# Patient Record
Sex: Male | Born: 1965 | Race: White | Hispanic: No | Marital: Married | Smoking: Former smoker
Health system: Southern US, Community
[De-identification: ages and names within clinical notes are randomized; demographics above are authoritative.]

## PROBLEM LIST (undated history)

## (undated) DIAGNOSIS — G5600 Carpal tunnel syndrome, unspecified upper limb: Secondary | ICD-10-CM

## (undated) DIAGNOSIS — M542 Cervicalgia: Secondary | ICD-10-CM

## (undated) DIAGNOSIS — I10 Essential (primary) hypertension: Secondary | ICD-10-CM

## (undated) DIAGNOSIS — R252 Cramp and spasm: Secondary | ICD-10-CM

## (undated) DIAGNOSIS — K219 Gastro-esophageal reflux disease without esophagitis: Secondary | ICD-10-CM

## (undated) DIAGNOSIS — Z86010 Personal history of colonic polyps: Secondary | ICD-10-CM

## (undated) DIAGNOSIS — T7840XA Allergy, unspecified, initial encounter: Secondary | ICD-10-CM

## (undated) DIAGNOSIS — K5790 Diverticulosis of intestine, part unspecified, without perforation or abscess without bleeding: Secondary | ICD-10-CM

## (undated) DIAGNOSIS — M1990 Unspecified osteoarthritis, unspecified site: Secondary | ICD-10-CM

## (undated) HISTORY — DX: Unspecified osteoarthritis, unspecified site: M19.90

## (undated) HISTORY — PX: HERNIA REPAIR: SHX51

## (undated) HISTORY — DX: Essential (primary) hypertension: I10

## (undated) HISTORY — PX: TYMPANOSTOMY TUBE PLACEMENT: SHX32

## (undated) HISTORY — PX: APPENDECTOMY: SHX54

## (undated) HISTORY — DX: Personal history of colonic polyps: Z86.010

## (undated) HISTORY — DX: Allergy, unspecified, initial encounter: T78.40XA

## (undated) HISTORY — DX: Carpal tunnel syndrome, unspecified upper limb: G56.00

## (undated) HISTORY — DX: Cramp and spasm: R25.2

## (undated) HISTORY — DX: Gastro-esophageal reflux disease without esophagitis: K21.9

## (undated) HISTORY — DX: Diverticulosis of intestine, part unspecified, without perforation or abscess without bleeding: K57.90

## (undated) HISTORY — DX: Cervicalgia: M54.2

---

## 2000-06-02 ENCOUNTER — Ambulatory Visit (HOSPITAL_COMMUNITY): Admission: RE | Admit: 2000-06-02 | Discharge: 2000-06-02 | Payer: Self-pay | Admitting: Surgery

## 2003-02-09 ENCOUNTER — Encounter: Admission: RE | Admit: 2003-02-09 | Discharge: 2003-02-09 | Payer: Self-pay | Admitting: Internal Medicine

## 2003-02-09 ENCOUNTER — Encounter: Payer: Self-pay | Admitting: Internal Medicine

## 2004-09-25 ENCOUNTER — Ambulatory Visit: Payer: Self-pay | Admitting: Internal Medicine

## 2006-04-08 ENCOUNTER — Ambulatory Visit: Payer: Self-pay | Admitting: Internal Medicine

## 2006-04-08 LAB — CONVERTED CEMR LAB
ALT: 30 units/L (ref 0–40)
AST: 29 units/L (ref 0–37)
Albumin: 4.3 g/dL (ref 3.5–5.2)
Alkaline Phosphatase: 53 units/L (ref 39–117)
BUN: 10 mg/dL (ref 6–23)
CO2: 32 meq/L (ref 19–32)
Calcium: 9.5 mg/dL (ref 8.4–10.5)
Chloride: 103 meq/L (ref 96–112)
Chol/HDL Ratio, serum: 2.7
Cholesterol: 153 mg/dL (ref 0–200)
Creatinine, Ser: 0.9 mg/dL (ref 0.4–1.5)
GFR calc non Af Amer: 99 mL/min
Glomerular Filtration Rate, Af Am: 120 mL/min/{1.73_m2}
Glucose, Bld: 98 mg/dL (ref 70–99)
HCT: 42.7 % (ref 39.0–52.0)
HDL: 56.6 mg/dL (ref >39.0)
Hemoglobin: 14.4 g/dL (ref 13.0–17.0)
LDL Cholesterol: 89 mg/dL (ref 0–99)
MCHC: 33.8 g/dL (ref 30.0–36.0)
MCV: 87.9 fL (ref 78.0–100.0)
Platelets: 166 10*3/uL (ref 150–400)
Potassium: 4.8 meq/L (ref 3.5–5.1)
RBC: 4.86 M/uL (ref 4.22–5.81)
RDW: 12.6 % (ref 11.5–14.6)
Sodium: 140 meq/L (ref 135–145)
TSH: 2.07 microintl units/mL (ref 0.35–5.50)
Total Bilirubin: 0.9 mg/dL (ref 0.3–1.2)
Total Protein: 7.2 g/dL (ref 6.0–8.3)
Triglyceride fasting, serum: 38 mg/dL (ref 0–149)
VLDL: 8 mg/dL (ref 0–40)
WBC: 7.1 10*3/uL (ref 4.5–10.5)

## 2006-08-20 ENCOUNTER — Ambulatory Visit: Payer: Self-pay | Admitting: Internal Medicine

## 2007-05-14 ENCOUNTER — Ambulatory Visit: Payer: Self-pay | Admitting: Internal Medicine

## 2007-10-19 ENCOUNTER — Telehealth (INDEPENDENT_AMBULATORY_CARE_PROVIDER_SITE_OTHER): Payer: Self-pay | Admitting: *Deleted

## 2007-10-20 ENCOUNTER — Ambulatory Visit: Payer: Self-pay | Admitting: Internal Medicine

## 2007-10-20 DIAGNOSIS — M546 Pain in thoracic spine: Secondary | ICD-10-CM

## 2007-10-22 ENCOUNTER — Telehealth: Payer: Self-pay | Admitting: Internal Medicine

## 2007-11-06 ENCOUNTER — Encounter: Payer: Self-pay | Admitting: Internal Medicine

## 2007-11-12 ENCOUNTER — Encounter: Admission: RE | Admit: 2007-11-12 | Discharge: 2007-11-12 | Payer: Self-pay | Admitting: Sports Medicine

## 2008-03-02 ENCOUNTER — Ambulatory Visit: Payer: Self-pay | Admitting: Internal Medicine

## 2010-09-14 NOTE — Op Note (Signed)
[facility]  Patient:    Dustin Mendez, Dustin Mendez                         MRN: [id number] Proc. Date: 06/02/00 Adm. Date:  [id number] Attending:  Abigail Miyamoto A                           Operative Report  PREOPERATIVE DIAGNOSIS:  Bilateral inguinal hernia.  POSTOPERATIVE DIAGNOSIS:  Bilateral inguinal hernia.  OPERATION/PROCEDURE:  Laparoscopic bilateral inguinal hernia repair with mesh.  SURGEON: Abigail Miyamoto, M.D.  ANESTHESIA:  General endotracheal anesthesia and 1/4% Marcaine plain.  ESTIMATED BLOOD LOSS:  Minimal.  DESCRIPTION OF PROCEDURE:  The patient was brought to the operating room, and identified as [name].  He was placed supine on the operating room table and general anesthesia was induced.  His abdomen was then prepped and draped in the usual sterile fashion.  Using a #15 blade a small transverse incision was made below the umbilicus.  The incision was carried down to the fascia which was then opened with the scalpel.  The rectus muscle was then identified and plane was developed underneath.  The dissecting balloon was then placed underneath the rectus sheath and manipulated down to the pelvis.  The dissecting balloon was then insufflated dissecting out the preperitoneal space.  Next, the dissecting balloon was removed and a small balloon ______ port was placed through the incision and insufflation with CO2 gas of the preperitoneal space was performed.  Next, two 5 mm ports were placed in the patients midline under direct vision. Dissection was then carried out in the left inguinal area.  The testicular cord and its structures were easily identified as well as a large direct hernia defect.  A small and direct hernia sac was also identified and reduced.  Next, dissection was carried out on the right cord structures as well.  The patient also had a bilateral inguinal hernia defect in this area which was easily reduced as  well.  Next, two separate 6 x 6 pieces of Mersilene mesh were brought out and inserted into the fascia appropriately.  Each piece of mesh was then passed through the umbilical trocar and then unfolded in both the right and left inguinal areas.  These pieces of mesh were then tacked in place to the Coopers ligament and pubic symphysis up the medial abdominal wall and out over the abdominal wall laterally.  Each piece of mesh was placed in an onlay fashion over the testicular cord structures and hernia defect.  Excellent coverage of both defects appeared to be achieved.  Hemostasis also appeared to be achieved.  At this point, the 5 mm ports were removed and the preperitoneal space was deflated allowing the mesh to lie back in place appropriately.  All ports were then removed.  The fascial defect of the umbilicus was then closed with a figure-of-eight #0 Vicryl suture.  All skin incisions were then anesthetized with 1/4% Marcaine and then closed with 4-0 Vicryl subcuticular sutures. Steri-Strips, gauze and tape were then applied.  The patient tolerated the procedure well.  All sponge, needle, and instrument counts were correct during the procedure.  The patient was then extubated in the operating room and taken, in stable condition, to the recovery room. DD:  06/02/00 TD:  06/03/00 Job: [id number]

## 2014-05-11 ENCOUNTER — Telehealth: Payer: Self-pay | Admitting: Internal Medicine

## 2014-05-11 NOTE — Telephone Encounter (Signed)
Ok w/ me 

## 2014-05-11 NOTE — Telephone Encounter (Signed)
Patient would like to transfer to Dr. Charlett Blake. Is this ok?

## 2014-05-11 NOTE — Telephone Encounter (Signed)
OK with me.

## 2014-05-12 NOTE — Telephone Encounter (Signed)
Appointment scheduled.

## 2014-10-21 ENCOUNTER — Encounter: Payer: Self-pay | Admitting: Family Medicine

## 2014-10-21 ENCOUNTER — Ambulatory Visit (INDEPENDENT_AMBULATORY_CARE_PROVIDER_SITE_OTHER): Payer: 59 | Admitting: Family Medicine

## 2014-10-21 VITALS — BP 120/78 | HR 63 | Temp 98.7°F | Ht 71.0 in | Wt 180.1 lb

## 2014-10-21 DIAGNOSIS — M542 Cervicalgia: Secondary | ICD-10-CM

## 2014-10-21 DIAGNOSIS — G5601 Carpal tunnel syndrome, right upper limb: Secondary | ICD-10-CM | POA: Diagnosis not present

## 2014-10-21 DIAGNOSIS — T7840XD Allergy, unspecified, subsequent encounter: Secondary | ICD-10-CM | POA: Diagnosis not present

## 2014-10-21 DIAGNOSIS — G5603 Carpal tunnel syndrome, bilateral upper limbs: Secondary | ICD-10-CM

## 2014-10-21 DIAGNOSIS — T7840XA Allergy, unspecified, initial encounter: Secondary | ICD-10-CM | POA: Insufficient documentation

## 2014-10-21 DIAGNOSIS — Z0000 Encounter for general adult medical examination without abnormal findings: Secondary | ICD-10-CM

## 2014-10-21 DIAGNOSIS — G5602 Carpal tunnel syndrome, left upper limb: Secondary | ICD-10-CM

## 2014-10-21 DIAGNOSIS — K219 Gastro-esophageal reflux disease without esophagitis: Secondary | ICD-10-CM

## 2014-10-21 DIAGNOSIS — G5600 Carpal tunnel syndrome, unspecified upper limb: Secondary | ICD-10-CM

## 2014-10-21 HISTORY — DX: Cervicalgia: M54.2

## 2014-10-21 HISTORY — DX: Carpal tunnel syndrome, unspecified upper limb: G56.00

## 2014-10-21 LAB — LIPID PANEL
Cholesterol: 180 mg/dL (ref 0–200)
HDL: 66.3 mg/dL (ref >39.00)
LDL Cholesterol: 99 mg/dL (ref 0–99)
NonHDL: 113.7
TRIGLYCERIDES: 73 mg/dL (ref 0.0–149.0)
Total CHOL/HDL Ratio: 3
VLDL: 14.6 mg/dL (ref 0.0–40.0)

## 2014-10-21 LAB — CBC
HCT: 44.2 % (ref 39.0–52.0)
Hemoglobin: 14.6 g/dL (ref 13.0–17.0)
MCHC: 33 g/dL (ref 30.0–36.0)
MCV: 88.2 fl (ref 78.0–100.0)
Platelets: 203 10*3/uL (ref 150.0–400.0)
RBC: 5.01 Mil/uL (ref 4.22–5.81)
RDW: 13.3 % (ref 11.5–15.5)
WBC: 6.2 10*3/uL (ref 4.0–10.5)

## 2014-10-21 LAB — COMPREHENSIVE METABOLIC PANEL
ALT: 41 U/L (ref 0–53)
AST: 35 U/L (ref 0–37)
Albumin: 4.4 g/dL (ref 3.5–5.2)
Alkaline Phosphatase: 54 U/L (ref 39–117)
BUN: 20 mg/dL (ref 6–23)
CO2: 32 mEq/L (ref 19–32)
Calcium: 9.4 mg/dL (ref 8.4–10.5)
Chloride: 100 mEq/L (ref 96–112)
Creatinine, Ser: 0.83 mg/dL (ref 0.40–1.50)
GFR: 104.71 mL/min (ref >60.00)
Glucose, Bld: 85 mg/dL (ref 70–99)
Potassium: 4.1 mEq/L (ref 3.5–5.1)
Sodium: 136 mEq/L (ref 135–145)
Total Bilirubin: 0.5 mg/dL (ref 0.2–1.2)
Total Protein: 7.4 g/dL (ref 6.0–8.3)

## 2014-10-21 LAB — TSH: TSH: 1.91 u[IU]/mL (ref 0.35–4.50)

## 2014-10-21 NOTE — Progress Notes (Signed)
Dustin Mendez  [id number] [birth year] 10/21/2014      Progress Note-Follow Up  Subjective  Chief Complaint  Chief Complaint  Patient presents with  . Establish Care    transfer Dr. Larose Kells    HPI  Patient is a 49 y.o. male in today for routine medical care. He is in today with his wife to establish care. He has not been seen in the [facility] 2009. He is generally healthy but does struggle with intermittent allergies, reflux, neck and shoulder pain. Does have some trouble with numbness tingling in his fingers at times he believes is related to arthritis and carpal tunnel. No recent acute illness or febrile concern. Denies CP/palp/SOB/HA/congestion/fevers/GI or GU c/o. Taking meds as prescribed  Past Medical History  Diagnosis Date  . Allergy   . Carpal tunnel syndrome   . Arthritis     Past Surgical History  Procedure Laterality Date  . Hernia repair    . Appendectomy      done as a teenager    Family History  Problem Relation Age of Onset  . Arthritis Mother   . Hypertension Mother   . Arthritis Father   . Hypertension Father     History   Social History  . Marital Status: Married    Spouse Name: N/A  . Number of Children: N/A  . Years of Education: 14   Occupational History  . Auto Mechanic    Social History Main Topics  . Smoking status: Never Smoker   . Smokeless tobacco: Not on file  . Alcohol Use: 0.0 oz/week    0 Standard drinks or equivalent per week  . Drug Use: No  . Sexual Activity: Not on file   Other Topics Concern  . Not on file   Social History Narrative  . No narrative on file    No current outpatient prescriptions on file prior to visit.   No current facility-administered medications on file prior to visit.    Allergies not on file  Review of Systems  Review of Systems  Constitutional: Negative for fever, chills and malaise/fatigue.  HENT: Negative for congestion, hearing loss and nosebleeds.   Eyes: Negative for discharge.    Respiratory: Negative for cough, sputum production, shortness of breath and wheezing.   Cardiovascular: Negative for chest pain, palpitations and leg swelling.  Gastrointestinal: Negative for heartburn, nausea, vomiting, abdominal pain, diarrhea, constipation and blood in stool.  Genitourinary: Negative for dysuria, urgency, frequency and hematuria.  Musculoskeletal: Positive for joint pain. Negative for myalgias, back pain and falls.  Skin: Negative for rash.  Neurological: Negative for dizziness, tremors, sensory change, focal weakness, loss of consciousness, weakness and headaches.  Endo/Heme/Allergies: Negative for polydipsia. Does not bruise/bleed easily.  Psychiatric/Behavioral: Negative for depression and suicidal ideas. The patient is not nervous/anxious and does not have insomnia.     Objective  BP 120/78 mmHg  Pulse 63  Temp(Src) 98.7 F (37.1 C) (Oral)  Ht 5\' 11"  (1.803 m)  Wt 180 lb 2 oz (81.704 kg)  BMI 25.13 kg/m2  SpO2 96%  Physical Exam  Physical Exam  Constitutional: He is oriented to person, place, and time and well-developed, well-nourished, and in no distress. No distress.  HENT:  Head: Normocephalic and atraumatic.  Eyes: Conjunctivae are normal.  Neck: Neck supple. No thyromegaly present.  Cardiovascular: Normal rate, regular rhythm and normal heart sounds.   No murmur heard. Pulmonary/Chest: Effort normal and breath sounds normal. No respiratory distress.  Abdominal: He exhibits no  distension and no mass. There is no tenderness.  Musculoskeletal: He exhibits no edema.  Neurological: He is alert and oriented to person, place, and time.  Skin: Skin is warm.  Psychiatric: Memory, affect and judgment normal.    Lab Results  Component Value Date   TSH 2.07 04/08/2006   Lab Results  Component Value Date   WBC 7.1 04/08/2006   HGB 14.4 04/08/2006   HCT 42.7 04/08/2006   MCV 87.9 04/08/2006   PLT 166 04/08/2006   Lab Results  Component Value Date    CREATININE 0.9 04/08/2006   BUN 10 04/08/2006   NA 140 04/08/2006   K 4.8 04/08/2006   CL 103 04/08/2006   CO2 32 04/08/2006   Lab Results  Component Value Date   ALT 30 04/08/2006   AST 29 04/08/2006   ALKPHOS 53 04/08/2006   BILITOT 0.9 04/08/2006   Lab Results  Component Value Date   CHOL 153 04/08/2006   Lab Results  Component Value Date   HDL 56.6 04/08/2006   Lab Results  Component Value Date   LDLCALC 89 04/08/2006   Lab Results  Component Value Date   TRIG 38 04/08/2006   Lab Results  Component Value Date   CHOLHDL 2.7 CALC 04/08/2006     Assessment & Plan  Preventative health care Patient encouraged to maintain heart healthy diet, regular exercise, adequate sleep. Consider daily probiotics. Take medications as prescribed. Given and reviewed copy of ACP documents from [employer] and encouraged to complete and return. Labs ordered and reviewed  Neck pain With some pain and numbess intermittently into right shoulder and arm. Encouraged moist heat and gentle stretching as tolerated. May try NSAIDs and prescription meds as directed and report if symptoms worsen or seek immediate care, salon pas patches and or gel and report if symptoms worsen  Allergy Has had allergy shots in past but is managing at present, he does feel the shots helped him in past. Is using Claritin and an over the counter herbal med called Zyflamed and reports this is helpful  Gastro-esophageal reflux Patient reports a weak stomach. Avoid offending foods, start probiotics. Do not eat large meals in late evening and consider raising head of bed.   CTS (carpal tunnel syndrome) Encouraged ice and pain patch with splinting and report if no improvement

## 2014-10-21 NOTE — Progress Notes (Signed)
Pre visit review using our clinic review tool, if applicable. No additional management support is needed unless otherwise documented below in the visit note. 

## 2014-10-21 NOTE — Patient Instructions (Addendum)
Can increase Claritin to twice daily Can add Flonase Can call to add Singulair/Montelukast Nasal saline after working outside [employer] or Aspercreme with Lidocaine patch Ice twice daily and splints at bed  Curcumen/Turmeric daily Vitamin D 2000 IU daily Flaxseed oil or seeds, freshly ground   Preventive Care for Adults A healthy lifestyle and preventive care can promote health and wellness. Preventive health guidelines for men include the following key practices:  A routine yearly physical is a good way to check with your health care provider about your health and preventative screening. It is a chance to share any concerns and updates on your health and to receive a thorough exam.  Visit your dentist for a routine exam and preventative care every 6 months. Brush your teeth twice a day and floss once a day. Good oral hygiene prevents tooth decay and gum disease.  The frequency of eye exams is based on your age, health, family medical history, use of contact lenses, and other factors. Follow your health care provider's recommendations for frequency of eye exams.  Eat a healthy diet. Foods such as vegetables, fruits, whole grains, low-fat dairy products, and lean protein foods contain the nutrients you need without too many calories. Decrease your intake of foods high in solid fats, added sugars, and salt. Eat the right amount of calories for you.Get information about a proper diet from your health care provider, if necessary.  Regular physical exercise is one of the most important things you can do for your health. Most adults should get at least 150 minutes of moderate-intensity exercise (any activity that increases your heart rate and causes you to sweat) each week. In addition, most adults need muscle-strengthening exercises on 2 or more days a week.  Maintain a healthy weight. The body mass index (BMI) is a screening tool to identify possible weight problems. It provides an  estimate of body fat based on height and weight. Your health care provider can find your BMI and can help you achieve or maintain a healthy weight.For adults 20 years and older:  A BMI below 18.5 is considered underweight.  A BMI of 18.5 to 24.9 is normal.  A BMI of 25 to 29.9 is considered overweight.  A BMI of 30 and above is considered obese.  Maintain normal blood lipids and cholesterol levels by exercising and minimizing your intake of saturated fat. Eat a balanced diet with plenty of fruit and vegetables. Blood tests for lipids and cholesterol should begin at age 53 and be repeated every 5 years. If your lipid or cholesterol levels are high, you are over 50, or you are at high risk for heart disease, you may need your cholesterol levels checked more frequently.Ongoing high lipid and cholesterol levels should be treated with medicines if diet and exercise are not working.  If you smoke, find out from your health care provider how to quit. If you do not use tobacco, do not start.  Lung cancer screening is recommended for adults aged 82-80 years who are at high risk for developing lung cancer because of a history of smoking. A yearly low-dose CT scan of the lungs is recommended for people who have at least a 30-pack-year history of smoking and are a current smoker or have quit within the past 15 years. A pack year of smoking is smoking an average of 1 pack of cigarettes a day for 1 year (for example: 1 pack a day for 30 years or 2 packs a day for  15 years). Yearly screening should continue until the smoker has stopped smoking for at least 15 years. Yearly screening should be stopped for people who develop a health problem that would prevent them from having lung cancer treatment.  If you choose to drink alcohol, do not have more than 2 drinks per day. One drink is considered to be 12 ounces (355 mL) of beer, 5 ounces (148 mL) of Guerrier, or 1.5 ounces (44 mL) of liquor.  Avoid use of street  drugs. Do not share needles with anyone. Ask for help if you need support or instructions about stopping the use of drugs.  High blood pressure causes heart disease and increases the risk of stroke. Your blood pressure should be checked at least every 1-2 years. Ongoing high blood pressure should be treated with medicines, if weight loss and exercise are not effective.  If you are 66-1 years old, ask your health care provider if you should take aspirin to prevent heart disease.  Diabetes screening involves taking a blood sample to check your fasting blood sugar level. This should be done once every 3 years, after age 102, if you are within normal weight and without risk factors for diabetes. Testing should be considered at a younger age or be carried out more frequently if you are overweight and have at least 1 risk factor for diabetes.  Colorectal cancer can be detected and often prevented. Most routine colorectal cancer screening begins at the age of 49 and continues through age 20. However, your health care provider may recommend screening at an earlier age if you have risk factors for colon cancer. On a yearly basis, your health care provider may provide home test kits to check for hidden blood in the stool. Use of a small camera at the end of a tube to directly examine the colon (sigmoidoscopy or colonoscopy) can detect the earliest forms of colorectal cancer. Talk to your health care provider about this at age 19, when routine screening begins. Direct exam of the colon should be repeated every 5-10 years through age 51, unless early forms of precancerous polyps or small growths are found.  People who are at an increased risk for hepatitis B should be screened for this virus. You are considered at high risk for hepatitis B if:  You were born in a country where hepatitis B occurs often. Talk with your health care provider about which countries are considered high risk.  Your parents were born in a  high-risk country and you have not received a shot to protect against hepatitis B (hepatitis B vaccine).  You have HIV or AIDS.  You use needles to inject street drugs.  You live with, or have sex with, someone who has hepatitis B.  You are a man who has sex with other men (MSM).  You get hemodialysis treatment.  You take certain medicines for conditions such as cancer, organ transplantation, and autoimmune conditions.  Hepatitis C blood testing is recommended for all people born from [birth year] through 1965 and any individual with known risks for hepatitis C.  Practice safe sex. Use condoms and avoid high-risk sexual practices to reduce the spread of sexually transmitted infections (STIs). STIs include gonorrhea, chlamydia, syphilis, trichomonas, herpes, HPV, and human immunodeficiency virus (HIV). Herpes, HIV, and HPV are viral illnesses that have no cure. They can result in disability, cancer, and death.  If you are at risk of being infected with HIV, it is recommended that you take a prescription medicine daily  to prevent HIV infection. This is called preexposure prophylaxis (PrEP). You are considered at risk if:  You are a man who has sex with other men (MSM) and have other risk factors.  You are a heterosexual man, are sexually active, and are at increased risk for HIV infection.  You take drugs by injection.  You are sexually active with a partner who has HIV.  Talk with your health care provider about whether you are at high risk of being infected with HIV. If you choose to begin PrEP, you should first be tested for HIV. You should then be tested every 3 months for as long as you are taking PrEP.  A one-time screening for abdominal aortic aneurysm (AAA) and surgical repair of large AAAs by ultrasound are recommended for men ages 39 to 23 years who are current or former smokers.  Healthy men should no longer receive prostate-specific antigen (PSA) blood tests as part of routine  cancer screening. Talk with your health care provider about prostate cancer screening.  Testicular cancer screening is not recommended for adult males who have no symptoms. Screening includes self-exam, a health care provider exam, and other screening tests. Consult with your health care provider about any symptoms you have or any concerns you have about testicular cancer.  Use sunscreen. Apply sunscreen liberally and repeatedly throughout the day. You should seek shade when your shadow is shorter than you. Protect yourself by wearing long sleeves, pants, a wide-brimmed hat, and sunglasses year round, whenever you are outdoors.  Once a month, do a whole-body skin exam, using a mirror to look at the skin on your back. Tell your health care provider about new moles, moles that have irregular borders, moles that are larger than a pencil eraser, or moles that have changed in shape or color.  Stay current with required vaccines (immunizations).  Influenza vaccine. All adults should be immunized every year.  Tetanus, diphtheria, and acellular pertussis (Td, Tdap) vaccine. An adult who has not previously received Tdap or who does not know his vaccine status should receive 1 dose of Tdap. This initial dose should be followed by tetanus and diphtheria toxoids (Td) booster doses every 10 years. Adults with an unknown or incomplete history of completing a 3-dose immunization series with Td-containing vaccines should begin or complete a primary immunization series including a Tdap dose. Adults should receive a Td booster every 10 years.  Varicella vaccine. An adult without evidence of immunity to varicella should receive 2 doses or a second dose if he has previously received 1 dose.  Human papillomavirus (HPV) vaccine. Males aged 81-21 years who have not received the vaccine previously should receive the 3-dose series. Males aged 22-26 years may be immunized. Immunization is recommended through the age of 89  years for any male who has sex with males and did not get any or all doses earlier. Immunization is recommended for any person with an immunocompromised condition through the age of 72 years if he did not get any or all doses earlier. During the 3-dose series, the second dose should be obtained 4-8 weeks after the first dose. The third dose should be obtained 24 weeks after the first dose and 16 weeks after the second dose.  Zoster vaccine. One dose is recommended for adults aged 68 years or older unless certain conditions are present.  Measles, mumps, and rubella (MMR) vaccine. Adults born before [birth year] generally are considered immune to measles and mumps. Adults born in [birth year] or later should  have 1 or more doses of MMR vaccine unless there is a contraindication to the vaccine or there is laboratory evidence of immunity to each of the three diseases. A routine second dose of MMR vaccine should be obtained at least 28 days after the first dose for students attending postsecondary schools, health care workers, or international travelers. People who received inactivated measles vaccine or an unknown type of measles vaccine during 1963-1967 should receive 2 doses of MMR vaccine. People who received inactivated mumps vaccine or an unknown type of mumps vaccine before 1979 and are at high risk for mumps infection should consider immunization with 2 doses of MMR vaccine. Unvaccinated health care workers born before [birth year] who lack laboratory evidence of measles, mumps, or rubella immunity or laboratory confirmation of disease should consider measles and mumps immunization with 2 doses of MMR vaccine or rubella immunization with 1 dose of MMR vaccine.  Pneumococcal 13-valent conjugate (PCV13) vaccine. When indicated, a person who is uncertain of his immunization history and has no record of immunization should receive the PCV13 vaccine. An adult aged 28 years or older who has certain medical conditions and has not been  previously immunized should receive 1 dose of PCV13 vaccine. This PCV13 should be followed with a dose of pneumococcal polysaccharide (PPSV23) vaccine. The PPSV23 vaccine dose should be obtained at least 8 weeks after the dose of PCV13 vaccine. An adult aged 44 years or older who has certain medical conditions and previously received 1 or more doses of PPSV23 vaccine should receive 1 dose of PCV13. The PCV13 vaccine dose should be obtained 1 or more years after the last PPSV23 vaccine dose.  Pneumococcal polysaccharide (PPSV23) vaccine. When PCV13 is also indicated, PCV13 should be obtained first. All adults aged 84 years and older should be immunized. An adult younger than age 13 years who has certain medical conditions should be immunized. Any person who resides in a nursing home or long-term care facility should be immunized. An adult smoker should be immunized. People with an immunocompromised condition and certain other conditions should receive both PCV13 and PPSV23 vaccines. People with human immunodeficiency virus (HIV) infection should be immunized as soon as possible after diagnosis. Immunization during chemotherapy or radiation therapy should be avoided. Routine use of PPSV23 vaccine is not recommended for American Indians, [place] Natives, or people younger than 65 years unless there are medical conditions that require PPSV23 vaccine. When indicated, people who have unknown immunization and have no record of immunization should receive PPSV23 vaccine. One-time revaccination 5 years after the first dose of PPSV23 is recommended for people aged 19-64 years who have chronic kidney failure, nephrotic syndrome, asplenia, or immunocompromised conditions. People who received 1-2 doses of PPSV23 before age 8 years should receive another dose of PPSV23 vaccine at age 68 years or later if at least 5 years have passed since the previous dose. Doses of PPSV23 are not needed for people immunized with PPSV23 at or  after age 61 years.  Meningococcal vaccine. Adults with asplenia or persistent complement component deficiencies should receive 2 doses of quadrivalent meningococcal conjugate (MenACWY-D) vaccine. The doses should be obtained at least 2 months apart. Microbiologists working with certain meningococcal bacteria, [place] recruits, people at risk during an outbreak, and people who travel to or live in countries with a high rate of meningitis should be immunized. A first-year college student up through age 82 years who is living in a residence hall should receive a dose if he did not receive  a dose on or after his 16th birthday. Adults who have certain high-risk conditions should receive one or more doses of vaccine.  Hepatitis A vaccine. Adults who wish to be protected from this disease, have certain high-risk conditions, work with hepatitis A-infected animals, work in hepatitis A research labs, or travel to or work in countries with a high rate of hepatitis A should be immunized. Adults who were previously unvaccinated and who anticipate close contact with an international adoptee during the first 60 days after arrival in the [place] from a country with a high rate of hepatitis A should be immunized.  Hepatitis B vaccine. Adults should be immunized if they wish to be protected from this disease, have certain high-risk conditions, may be exposed to blood or other infectious body fluids, are household contacts or sex partners of hepatitis B positive people, are clients or workers in certain care facilities, or travel to or work in countries with a high rate of hepatitis B.  Haemophilus influenzae type b (Hib) vaccine. A previously unvaccinated person with asplenia or sickle cell disease or having a scheduled splenectomy should receive 1 dose of Hib vaccine. Regardless of previous immunization, a recipient of a hematopoietic stem cell transplant should receive a 3-dose series 6-12 months after his  successful transplant. Hib vaccine is not recommended for adults with HIV infection. Preventive Service / Frequency Ages 31 to 59  Blood pressure check.** / Every 1 to 2 years.  Lipid and cholesterol check.** / Every 5 years beginning at age 45.  Hepatitis C blood test.** / For any individual with known risks for hepatitis C.  Skin self-exam. / Monthly.  Influenza vaccine. / Every year.  Tetanus, diphtheria, and acellular pertussis (Tdap, Td) vaccine.** / Consult your health care provider. 1 dose of Td every 10 years.  Varicella vaccine.** / Consult your health care provider.  HPV vaccine. / 3 doses over 6 months, if 42 or younger.  Measles, mumps, rubella (MMR) vaccine.** / You need at least 1 dose of MMR if you were born in 1957 or later. You may also need a second dose.  Pneumococcal 13-valent conjugate (PCV13) vaccine.** / Consult your health care provider.  Pneumococcal polysaccharide (PPSV23) vaccine.** / 1 to 2 doses if you smoke cigarettes or if you have certain conditions.  Meningococcal vaccine.** / 1 dose if you are age 72 to 83 years and a Market researcher living in a residence hall, or have one of several medical conditions. You may also need additional booster doses.  Hepatitis A vaccine.** / Consult your health care provider.  Hepatitis B vaccine.** / Consult your health care provider.  Haemophilus influenzae type b (Hib) vaccine.** / Consult your health care provider. Ages 57 to 1  Blood pressure check.** / Every 1 to 2 years.  Lipid and cholesterol check.** / Every 5 years beginning at age 61.  Lung cancer screening. / Every year if you are aged 27-80 years and have a 30-pack-year history of smoking and currently smoke or have quit within the past 15 years. Yearly screening is stopped once you have quit smoking for at least 15 years or develop a health problem that would prevent you from having lung cancer treatment.  Fecal occult blood test (FOBT)  of stool. / Every year beginning at age 38 and continuing until age 49. You may not have to do this test if you get a colonoscopy every 10 years.  Flexible sigmoidoscopy** or colonoscopy.** / Every 5 years for a  flexible sigmoidoscopy or every 10 years for a colonoscopy beginning at age 66 and continuing until age 38.  Hepatitis C blood test.** / For all people born from [birth year] through 1965 and any individual with known risks for hepatitis C.  Skin self-exam. / Monthly.  Influenza vaccine. / Every year.  Tetanus, diphtheria, and acellular pertussis (Tdap/Td) vaccine.** / Consult your health care provider. 1 dose of Td every 10 years.  Varicella vaccine.** / Consult your health care provider.  Zoster vaccine.** / 1 dose for adults aged 75 years or older.  Measles, mumps, rubella (MMR) vaccine.** / You need at least 1 dose of MMR if you were born in 1957 or later. You may also need a second dose.  Pneumococcal 13-valent conjugate (PCV13) vaccine.** / Consult your health care provider.  Pneumococcal polysaccharide (PPSV23) vaccine.** / 1 to 2 doses if you smoke cigarettes or if you have certain conditions.  Meningococcal vaccine.** / Consult your health care provider.  Hepatitis A vaccine.** / Consult your health care provider.  Hepatitis B vaccine.** / Consult your health care provider.  Haemophilus influenzae type b (Hib) vaccine.** / Consult your health care provider. Ages 52 and over  Blood pressure check.** / Every 1 to 2 years.  Lipid and cholesterol check.**/ Every 5 years beginning at age 75.  Lung cancer screening. / Every year if you are aged 75-80 years and have a 30-pack-year history of smoking and currently smoke or have quit within the past 15 years. Yearly screening is stopped once you have quit smoking for at least 15 years or develop a health problem that would prevent you from having lung cancer treatment.  Fecal occult blood test (FOBT) of stool. / Every year  beginning at age 43 and continuing until age 105. You may not have to do this test if you get a colonoscopy every 10 years.  Flexible sigmoidoscopy** or colonoscopy.** / Every 5 years for a flexible sigmoidoscopy or every 10 years for a colonoscopy beginning at age 73 and continuing until age 10.  Hepatitis C blood test.** / For all people born from [birth year] through 1965 and any individual with known risks for hepatitis C.  Abdominal aortic aneurysm (AAA) screening.** / A one-time screening for ages 48 to 50 years who are current or former smokers.  Skin self-exam. / Monthly.  Influenza vaccine. / Every year.  Tetanus, diphtheria, and acellular pertussis (Tdap/Td) vaccine.** / 1 dose of Td every 10 years.  Varicella vaccine.** / Consult your health care provider.  Zoster vaccine.** / 1 dose for adults aged 60 years or older.  Pneumococcal 13-valent conjugate (PCV13) vaccine.** / Consult your health care provider.  Pneumococcal polysaccharide (PPSV23) vaccine.** / 1 dose for all adults aged 48 years and older.  Meningococcal vaccine.** / Consult your health care provider.  Hepatitis A vaccine.** / Consult your health care provider.  Hepatitis B vaccine.** / Consult your health care provider.  Haemophilus influenzae type b (Hib) vaccine.** / Consult your health care provider. **Family history and personal history of risk and conditions may change your health care provider's recommendations. Document Released: 06/11/2001 Document Revised: 04/20/2013 Document Reviewed: 09/10/2010 [facility] Patient Information 2015 [place], [place]. This information is not intended to replace advice given to you by your health care provider. Make sure you discuss any questions you have with your health care provider.

## 2014-11-06 ENCOUNTER — Encounter: Payer: Self-pay | Admitting: Family Medicine

## 2014-11-06 DIAGNOSIS — Z0000 Encounter for general adult medical examination without abnormal findings: Secondary | ICD-10-CM | POA: Insufficient documentation

## 2014-11-06 DIAGNOSIS — K219 Gastro-esophageal reflux disease without esophagitis: Secondary | ICD-10-CM | POA: Insufficient documentation

## 2014-11-06 HISTORY — DX: Gastro-esophageal reflux disease without esophagitis: K21.9

## 2014-11-06 NOTE — Assessment & Plan Note (Signed)
Patient encouraged to maintain heart healthy diet, regular exercise, adequate sleep. Consider daily probiotics. Take medications as prescribed. Given and reviewed copy of ACP documents from [place] Secretary of State and encouraged to complete and return. Labs ordered and reviewed.  

## 2014-11-06 NOTE — Assessment & Plan Note (Signed)
Has had allergy shots in past but is managing at present, he does feel the shots helped him in past. Is using Claritin and an over the counter herbal med called Zyflamed and reports this is helpful

## 2014-11-06 NOTE — Assessment & Plan Note (Signed)
With some pain and numbess intermittently into right shoulder and arm. Encouraged moist heat and gentle stretching as tolerated. May try NSAIDs and prescription meds as directed and report if symptoms worsen or seek immediate care, salon pas patches and or gel and report if symptoms worsen

## 2014-11-06 NOTE — Assessment & Plan Note (Signed)
Encouraged ice and pain patch with splinting and report if no improvement

## 2014-11-06 NOTE — Assessment & Plan Note (Signed)
Patient reports a weak stomach. Avoid offending foods, start probiotics. Do not eat large meals in late evening and consider raising head of bed.

## 2015-10-23 ENCOUNTER — Encounter: Payer: Self-pay | Admitting: Family Medicine

## 2015-10-23 ENCOUNTER — Ambulatory Visit (INDEPENDENT_AMBULATORY_CARE_PROVIDER_SITE_OTHER): Payer: 59 | Admitting: Family Medicine

## 2015-10-23 VITALS — BP 108/64 | HR 62 | Temp 98.1°F | Ht 71.0 in | Wt 167.4 lb

## 2015-10-23 DIAGNOSIS — G5603 Carpal tunnel syndrome, bilateral upper limbs: Secondary | ICD-10-CM

## 2015-10-23 DIAGNOSIS — Z0000 Encounter for general adult medical examination without abnormal findings: Secondary | ICD-10-CM | POA: Diagnosis not present

## 2015-10-23 DIAGNOSIS — K219 Gastro-esophageal reflux disease without esophagitis: Secondary | ICD-10-CM | POA: Diagnosis not present

## 2015-10-23 DIAGNOSIS — T7840XD Allergy, unspecified, subsequent encounter: Secondary | ICD-10-CM

## 2015-10-23 LAB — COMPREHENSIVE METABOLIC PANEL
ALBUMIN: 4 g/dL (ref 3.5–5.2)
ALT: 22 U/L (ref 0–53)
AST: 25 U/L (ref 0–37)
Alkaline Phosphatase: 54 U/L (ref 39–117)
BUN: 13 mg/dL (ref 6–23)
CALCIUM: 8.9 mg/dL (ref 8.4–10.5)
CHLORIDE: 103 meq/L (ref 96–112)
CO2: 30 mEq/L (ref 19–32)
Creatinine, Ser: 0.77 mg/dL (ref 0.40–1.50)
GFR: 113.71 mL/min (ref >60.00)
Glucose, Bld: 96 mg/dL (ref 70–99)
POTASSIUM: 3.9 meq/L (ref 3.5–5.1)
SODIUM: 138 meq/L (ref 135–145)
Total Bilirubin: 0.3 mg/dL (ref 0.2–1.2)
Total Protein: 6.8 g/dL (ref 6.0–8.3)

## 2015-10-23 LAB — LIPID PANEL
CHOLESTEROL: 150 mg/dL (ref 0–200)
HDL: 59.1 mg/dL (ref >39.00)
LDL Cholesterol: 82 mg/dL (ref 0–99)
NonHDL: 91.02
Total CHOL/HDL Ratio: 3
Triglycerides: 46 mg/dL (ref 0.0–149.0)
VLDL: 9.2 mg/dL (ref 0.0–40.0)

## 2015-10-23 LAB — CBC
HEMATOCRIT: 42.8 % (ref 39.0–52.0)
Hemoglobin: 14.4 g/dL (ref 13.0–17.0)
MCHC: 33.7 g/dL (ref 30.0–36.0)
MCV: 87.7 fl (ref 78.0–100.0)
Platelets: 201 10*3/uL (ref 150.0–400.0)
RBC: 4.87 Mil/uL (ref 4.22–5.81)
RDW: 14.1 % (ref 11.5–15.5)
WBC: 8.2 10*3/uL (ref 4.0–10.5)

## 2015-10-23 LAB — TSH: TSH: 2.57 u[IU]/mL (ref 0.35–4.50)

## 2015-10-23 NOTE — Assessment & Plan Note (Signed)
Zyflomend (sp?) for his hands has had good resutls when hands flared. Made by [employer].

## 2015-10-23 NOTE — Progress Notes (Signed)
Pre visit review using our clinic review tool, if applicable. No additional management support is needed unless otherwise documented below in the visit note. 

## 2015-10-23 NOTE — Assessment & Plan Note (Signed)
Patient encouraged to maintain heart healthy diet, regular exercise, adequate sleep. Consider daily probiotics. Take medications as prescribed. Given and reviewed copy of ACP documents from [place] Secretary of State and encouraged to complete and return 

## 2015-10-23 NOTE — Patient Instructions (Signed)

## 2015-10-23 NOTE — Assessment & Plan Note (Signed)
Has had some stress recently which has flared his stomach some. More churning in stomach. Avoid offending foods, start probiotics. Do not eat large meals in late evening and consider raising head of bed.

## 2015-10-23 NOTE — Progress Notes (Signed)
Patient ID: RAANAN AUGUSTYNIAK, male   DOB: January 13, 1966, 50 y.o.   MRN: [id number]   Subjective:    Patient ID: Barbra Sarks, male    DOB: 1965-07-14, 50 y.o.   MRN: [id number]  Chief Complaint  Patient presents with  . Annual Exam    HPI Patient is in today for annual exam. He feels well today. He does acknowledge a great deal of stress lately with his son and father but no significant anhedonia. He acknowledges his stomach flares up when he feels stressed with some cramping/discomfort. Denies CP/palp/SOB/HA/congestion/fevers/GI or GU c/o. Taking meds as prescribed  Past Medical History  Diagnosis Date  . Arthritis   . Neck pain 10/21/2014  . Allergy   . Carpal tunnel syndrome   . CTS (carpal tunnel syndrome) 10/21/2014  . Osteoporosis   . Preventative health care 11/06/2014  . Gastro-esophageal reflux 11/06/2014    Past Surgical History  Procedure Laterality Date  . Appendectomy      done as a teenager  . Tympanostomy tube placement Bilateral     childhood  . Hernia repair      umbilical    Family History  Problem Relation Age of Onset  . Arthritis Mother   . Hypertension Mother   . Cholelithiasis Mother   . Arthritis Father   . Hypertension Father   . Heart disease Father     arrythmia s/p ablation  . Hyperlipidemia Father   . Diabetes Father   . Thyroid disease Brother   . COPD Maternal Grandfather     silicosis? work exposure and a smoker  . Heart disease Paternal Grandmother     arrythmia dies while placing pacer  . Multiple sclerosis Paternal Grandfather     Social History   Social History  . Marital Status: Married    Spouse Name: N/A  . Number of Children: N/A  . Years of Education: 14   Occupational History  . Auto Mechanic    Social History Main Topics  . Smoking status: Former Smoker    Types: Cigarettes    Quit date: 02/19/2014  . Smokeless tobacco: Former Systems developer    Types: Chew    Quit date: 02/01/2014  . Alcohol Use: 0.0 oz/week    0 Standard  drinks or equivalent per week     Comment: beer   . Drug Use: No  . 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 Not on file   Social History Narrative    Outpatient Prescriptions Prior to Visit  Medication Sig Dispense Refill  . loratadine (CLARITIN) 10 MG tablet Take 10 mg by mouth daily.     No facility-administered medications prior to visit.    Not on File  Review of Systems  Constitutional: Negative for fever, chills and malaise/fatigue.  HENT: Negative for congestion and hearing loss.   Eyes: Negative for discharge.  Respiratory: Negative for cough, sputum production and shortness of breath.   Cardiovascular: Negative for chest pain, palpitations and leg swelling.  Gastrointestinal: Negative for heartburn, nausea, vomiting, abdominal pain, diarrhea, constipation and blood in stool.  Genitourinary: Negative for dysuria, urgency, frequency and hematuria.  Musculoskeletal: Negative for myalgias, back pain and falls.  Skin: Negative for rash.  Neurological: Negative for dizziness, sensory change, loss of consciousness, weakness and headaches.  Endo/Heme/Allergies: Negative for environmental allergies. Does not bruise/bleed easily.  Psychiatric/Behavioral: Negative for depression  and suicidal ideas. The patient is not nervous/anxious and does not have insomnia.        Objective:    Physical Exam  Constitutional: He is oriented to person, place, and time. He appears well-developed and well-nourished. No distress.  HENT:  Head: Normocephalic and atraumatic.  Eyes: Conjunctivae are normal.  Neck: Neck supple. No thyromegaly present.  Cardiovascular: Normal rate, regular rhythm and normal heart sounds.   No murmur heard. Pulmonary/Chest: Effort normal and breath sounds normal. No respiratory distress. He has no wheezes.  Abdominal: Soft. Bowel sounds are normal. He exhibits no mass.  There is no tenderness.  Musculoskeletal: He exhibits no edema.  Lymphadenopathy:    He has no cervical adenopathy.  Neurological: He is alert and oriented to person, place, and time.  Skin: Skin is warm and dry.  Psychiatric: He has a normal mood and affect. His behavior is normal.    BP 108/64 mmHg  Pulse 62  Temp(Src) 98.1 F (36.7 C) (Oral)  Ht 5\' 11"  (1.803 m)  Wt 167 lb 6 oz (75.921 kg)  BMI 23.35 kg/m2  SpO2 98% Wt Readings from Last 3 Encounters:  10/23/15 167 lb 6 oz (75.921 kg)  10/21/14 180 lb 2 oz (81.704 kg)  10/20/07 164 lb 6.4 oz (74.571 kg)     Lab Results  Component Value Date   WBC 6.2 10/21/2014   HGB 14.6 10/21/2014   HCT 44.2 10/21/2014   PLT 203.0 10/21/2014   GLUCOSE 85 10/21/2014   CHOL 180 10/21/2014   TRIG 73.0 10/21/2014   HDL 66.30 10/21/2014   LDLCALC 99 10/21/2014   ALT 41 10/21/2014   AST 35 10/21/2014   NA 136 10/21/2014   K 4.1 10/21/2014   CL 100 10/21/2014   CREATININE 0.83 10/21/2014   BUN 20 10/21/2014   CO2 32 10/21/2014   TSH 1.91 10/21/2014    Lab Results  Component Value Date   TSH 1.91 10/21/2014   Lab Results  Component Value Date   WBC 6.2 10/21/2014   HGB 14.6 10/21/2014   HCT 44.2 10/21/2014   MCV 88.2 10/21/2014   PLT 203.0 10/21/2014   Lab Results  Component Value Date   NA 136 10/21/2014   K 4.1 10/21/2014   CO2 32 10/21/2014   GLUCOSE 85 10/21/2014   BUN 20 10/21/2014   CREATININE 0.83 10/21/2014   BILITOT 0.5 10/21/2014   ALKPHOS 54 10/21/2014   AST 35 10/21/2014   ALT 41 10/21/2014   PROT 7.4 10/21/2014   ALBUMIN 4.4 10/21/2014   CALCIUM 9.4 10/21/2014   GFR 104.71 10/21/2014   Lab Results  Component Value Date   CHOL 180 10/21/2014   Lab Results  Component Value Date   HDL 66.30 10/21/2014   Lab Results  Component Value Date   LDLCALC 99 10/21/2014   Lab Results  Component Value Date   TRIG 73.0 10/21/2014   Lab Results  Component Value Date   CHOLHDL 3 10/21/2014   No  results found for: HGBA1C     Assessment & Plan:   Problem List Items Addressed This Visit    Preventative health care - Primary    Patient encouraged to maintain heart healthy diet, regular exercise, adequate sleep. Consider daily probiotics. Take m. edications as prescribed. Given and reviewed copy of ACP documents from [place] [facility] [place] and encouraged to complete and return      Relevant Orders   TSH   CBC   Lipid panel  Comprehensive metabolic panel   Gastro-esophageal reflux    Has had some stress recently which has flared his stomach some. More churning in stomach. Avoid offending foods, start probiotics. Do not eat large meals in late evening and consider raising head of bed.       CTS (carpal tunnel syndrome)    Zyflomend (sp?) for his hands has had good resutls when hands flared. Made by [employer].       Allergy    Doing well most days, has one season that is the worst can increase to bid on the Claritin and/or Flonase         I am having Mr. Mctavish maintain his loratadine.  No orders of the defined types were placed in this encounter.     Penni Homans, MD

## 2015-10-23 NOTE — Assessment & Plan Note (Signed)
Doing well most days, has one season that is the worst can increase to bid on the Claritin and/or Flonase

## 2015-11-09 ENCOUNTER — Telehealth: Payer: Self-pay | Admitting: Family Medicine

## 2015-11-09 NOTE — Telephone Encounter (Signed)
Please see below.

## 2015-11-09 NOTE — Telephone Encounter (Signed)
I put in the referral for reflux and a note on it for first colonoscopy after his birthday. Please clarify with GI if they need me to reorder it to schedule the colonoscopy and let patient know

## 2015-11-09 NOTE — Telephone Encounter (Signed)
Thanks can someone explain to the patient that he has to go in and discuss with them about his heartburn and they will schedule his 50 year old colonoscopy after that. They never do the colonoscopy at first visit anyway.

## 2015-11-09 NOTE — Telephone Encounter (Signed)
Referral was put in for Reflux. We have to schedule an office visit if the person has a problem even though he is due for a colonoscopy. We can always schedule the colonoscopy when the patient comes in for an office visit.

## 2015-11-09 NOTE — Telephone Encounter (Signed)
Patient did received appt/referral for GI, but they stated it was for reflux, he was under the understanding it was to be for a colonoscopy.  He canceled the appt. And would like some clarification on exactly what the referral was for.

## 2015-11-10 ENCOUNTER — Other Ambulatory Visit: Payer: Self-pay | Admitting: Family Medicine

## 2015-11-10 ENCOUNTER — Encounter: Payer: Self-pay | Admitting: Gastroenterology

## 2015-11-10 DIAGNOSIS — Z1211 Encounter for screening for malignant neoplasm of colon: Secondary | ICD-10-CM

## 2015-11-10 NOTE — Telephone Encounter (Signed)
I called the patient and did explain this information to him.  He states that he does not have reflux/heartburn and never has and that is what has him confused. He only knows that he is coming up on 50 and just wants to have a colonoscopy since routine at his age.   He states his parents do and wonders if they have somehow been confused with him.  I will forward this note back to PCP as do not know how/what else to do.

## 2015-11-10 NOTE — Telephone Encounter (Signed)
He and I must have mis communicated so I will just redo his referral

## 2015-11-10 NOTE — Telephone Encounter (Signed)
Patient informed. 

## 2016-01-03 ENCOUNTER — Ambulatory Visit (AMBULATORY_SURGERY_CENTER): Payer: Self-pay

## 2016-01-03 VITALS — Ht 71.0 in | Wt 168.8 lb

## 2016-01-03 DIAGNOSIS — Z1211 Encounter for screening for malignant neoplasm of colon: Secondary | ICD-10-CM

## 2016-01-03 MED ORDER — SUPREP BOWEL PREP KIT 17.5-3.13-1.6 GM/177ML PO SOLN: 1.0000 | Freq: Once | ORAL | 0 refills | Status: AC

## 2016-01-03 NOTE — Progress Notes (Signed)
No allergies to eggs or soy No past problems with anesthesia No home oxygen No diet meds  Has email and internet; registered emmi

## 2016-01-04 ENCOUNTER — Telehealth: Payer: Self-pay | Admitting: Family Medicine

## 2016-01-04 NOTE — Telephone Encounter (Signed)
°  Relationship to patient: Self r Can be reached: [phone]    Reason for call: Patient is requesting that a copy of his labs and office visit notes from his CPE be printed so that he can come pick it up on Monday.

## 2016-01-04 NOTE — Telephone Encounter (Signed)
Printed office notes/labs/put at the front desk. Patient informed copies at the front desk.

## 2016-01-08 ENCOUNTER — Encounter: Payer: Self-pay | Admitting: Gastroenterology

## 2016-01-17 ENCOUNTER — Encounter: Payer: Self-pay | Admitting: Gastroenterology

## 2016-01-17 ENCOUNTER — Ambulatory Visit (AMBULATORY_SURGERY_CENTER): Payer: 59 | Admitting: Gastroenterology

## 2016-01-17 VITALS — BP 119/79 | HR 68 | Temp 98.2°F | Resp 20 | Ht 71.0 in | Wt 168.0 lb

## 2016-01-17 DIAGNOSIS — Z1211 Encounter for screening for malignant neoplasm of colon: Secondary | ICD-10-CM

## 2016-01-17 DIAGNOSIS — D128 Benign neoplasm of rectum: Secondary | ICD-10-CM

## 2016-01-17 DIAGNOSIS — K635 Polyp of colon: Secondary | ICD-10-CM | POA: Diagnosis not present

## 2016-01-17 DIAGNOSIS — K6289 Other specified diseases of anus and rectum: Secondary | ICD-10-CM

## 2016-01-17 DIAGNOSIS — D129 Benign neoplasm of anus and anal canal: Secondary | ICD-10-CM

## 2016-01-17 DIAGNOSIS — D123 Benign neoplasm of transverse colon: Secondary | ICD-10-CM

## 2016-01-17 MED ORDER — SODIUM CHLORIDE 0.9 % IV SOLN: 500.0000 mL | INTRAVENOUS | Status: DC

## 2016-01-17 NOTE — Progress Notes (Signed)
Report to PACU, RN, vss, BBS= Clear.  

## 2016-01-17 NOTE — Op Note (Signed)
[facility] Patient Name: Dustin Mendez Procedure Date: 01/17/2016 11:11 AM MRN: [id number] Endoscopist: Remo Lipps P. Havery Moros , MD Age: 50 Referring MD:  Date of Birth: Oct 13, 1965 Gender: Male Account #: [id number] Procedure:                Colonoscopy Indications:              Screening for malignant neoplasm in the colon, This                            is the patient's first colonoscopy Medicines:                Monitored Anesthesia Care Procedure:                Pre-Anesthesia Assessment:                           - Prior to the procedure, a History and Physical                            was performed, and patient medications and                            allergies were reviewed. The patient's tolerance of                            previous anesthesia was also reviewed. The risks                            and benefits of the procedure and the sedation                            options and risks were discussed with the patient.                            All questions were answered, and informed consent                            was obtained. Prior Anticoagulants: The patient has                            taken no previous anticoagulant or antiplatelet                            agents. ASA Grade Assessment: I - A normal, healthy                            patient. After reviewing the risks and benefits,                            the patient was deemed in satisfactory condition to                            undergo the procedure.  After obtaining informed consent, the colonoscope                            was passed under direct vision. Throughout the                            procedure, the patient's blood pressure, pulse, and                            oxygen saturations were monitored continuously. The                            Model CF-HQ190L [id number]) scope was introduced                            through the anus and advanced to  the the cecum,                            identified by appendiceal orifice and ileocecal                            valve. The colonoscopy was performed without                            difficulty. The patient tolerated the procedure                            well. The quality of the bowel preparation was                            good. The ileocecal valve, appendiceal orifice, and                            rectum were photographed. Scope In: 11:18:13 AM Scope Out: 11:40:41 AM Scope Withdrawal Time: 0 hours 20 minutes 20 seconds  Total Procedure Duration: 0 hours 22 minutes 28 seconds  Findings:                 The perianal and digital rectal examinations were                            normal.                           Two flat polyps were found in the transverse colon.                            The polyps were 3 to 5 mm in size. These polyps                            were removed with a cold snare. Resection and                            retrieval were complete.  A few small-mouthed diverticula were found in the                            sigmoid colon and transverse colon.                           Anal papilla(e) were hypertrophied and lobulated.                            The end of the largest papillae appeared                            potentially adenomatous. Biopsies were taken with a                            cold forceps for histology to ensure no adenomatous                            change.                           Non-bleeding internal hemorrhoids were found during                            retroflexion.                           The exam was otherwise without abnormality. Complications:            No immediate complications. Estimated blood loss:                            Minimal. Estimated Blood Loss:     Estimated blood loss was minimal. Impression:               - Two 3 to 5 mm polyps in the transverse colon,                             removed with a cold snare. Resected and retrieved.                           - Diverticulosis in the sigmoid colon and in the                            transverse colon.                           - Anal papilla(e) were hypertrophied. Biopsied to                            ensure no adenomatous change.                           - Non-bleeding internal hemorrhoids.                           -  The examination was otherwise normal. Recommendation:           - Patient has a contact number available for                            emergencies. The signs and symptoms of potential                            delayed complications were discussed with the                            patient. Return to normal activities tomorrow.                            Written discharge instructions were provided to the                            patient.                           - Resume previous diet.                           - Continue present medications.                           - No aspirin, ibuprofen, naproxen, or other                            non-steroidal anti-inflammatory drugs for 2 weeks                            after polyp removal.                           - Await pathology results.                           - Repeat colonoscopy is recommended for                            surveillance. The colonoscopy date will be                            determined after pathology results from today's                            exam become available for review. Remo Lipps P. Armbruster, MD 01/17/2016 11:48:33 AM This report has been signed electronically.

## 2016-01-17 NOTE — Progress Notes (Signed)
Called to room to assist during endoscopic procedure.  Patient ID and intended procedure confirmed with present staff. Received instructions for my participation in the procedure from the performing physician.  

## 2016-01-17 NOTE — Patient Instructions (Signed)
YOU HAD AN ENDOSCOPIC PROCEDURE TODAY AT [facility]:   Refer to the procedure report that was given to you for any specific questions about what was found during the examination.  If the procedure report does not answer your questions, please call your gastroenterologist to clarify.  If you requested that your care partner not be given the details of your procedure findings, then the procedure report has been included in a sealed envelope for you to review at your convenience later.  YOU SHOULD EXPECT: Some feelings of bloating in the abdomen. Passage of more gas than usual.  Walking can help get rid of the air that was put into your GI tract during the procedure and reduce the bloating. If you had a lower endoscopy (such as a colonoscopy or flexible sigmoidoscopy) you may notice spotting of blood in your stool or on the toilet paper. If you underwent a bowel prep for your procedure, you may not have a normal bowel movement for a few days.  Please Note:  You might notice some irritation and congestion in your nose or some drainage.  This is from the oxygen used during your procedure.  There is no need for concern and it should clear up in a day or so.  SYMPTOMS TO REPORT IMMEDIATELY:   Following lower endoscopy (colonoscopy or flexible sigmoidoscopy):  Excessive amounts of blood in the stool  Significant tenderness or worsening of abdominal pains  Swelling of the abdomen that is new, acute  Fever of 100F or higher    For urgent or emergent issues, a gastroenterologist can be reached at any hour by calling [phone].   DIET:  We do recommend a small meal at first, but then you may proceed to your regular diet.  Drink plenty of fluids but you should avoid alcoholic beverages for 24 hours.  ACTIVITY:  You should plan to take it easy for the rest of today and you should NOT DRIVE or use heavy machinery until tomorrow (because of the sedation medicines used during the test).     FOLLOW UP: Our staff will call the number listed on your records the next business day following your procedure to check on you and address any questions or concerns that you may have regarding the information given to you following your procedure. If we do not reach you, we will leave a message.  However, if you are feeling well and you are not experiencing any problems, there is no need to return our call.  We will assume that you have returned to your regular daily activities without incident.  If any biopsies were taken you will be contacted by phone or by letter within the next 1-3 weeks.  Please call us at [phone] if you have not heard about the biopsies in 3 weeks.    SIGNATURES/CONFIDENTIALITY: You and/or your care partner have signed paperwork which will be entered into your electronic medical record.  These signatures attest to the fact that that the information above on your After Visit Summary has been reviewed and is understood.  Full responsibility of the confidentiality of this discharge information lies with you and/or your care-partner.    No aspirin,lbuprofen,naproxen,or other non-steroidal anti-inflammatory drugs for 2 weeks,but resume remainder of medications. Information given on polyps,diverticulosis and hemorrhoids.

## 2016-01-18 ENCOUNTER — Telehealth: Payer: Self-pay

## 2016-01-18 NOTE — Telephone Encounter (Signed)
  Follow up Call-  Call back number 01/17/2016  Post procedure Call Back phone  # [phone]  Permission to leave phone message Yes  Some recent data might be hidden     Patient questions:  Do you have a fever, pain , or abdominal swelling? No. Pain Score  0 *  Have you tolerated food without any problems? Yes.    Have you been able to return to your normal activities? Yes.    Do you have any questions about your discharge instructions: Diet   No. Medications  No. Follow up visit  No.  Do you have questions or concerns about your Care? No.  Actions: * If pain score is 4 or above: No action needed, pain <4.

## 2016-10-24 ENCOUNTER — Encounter: Payer: 59 | Admitting: Family Medicine

## 2016-11-22 ENCOUNTER — Ambulatory Visit (HOSPITAL_BASED_OUTPATIENT_CLINIC_OR_DEPARTMENT_OTHER)
Admission: RE | Admit: 2016-11-22 | Discharge: 2016-11-22 | Disposition: A | Discharge status: Home / Self Care | Payer: 59 | Source: Ambulatory Visit | Attending: Family Medicine | Admitting: Family Medicine

## 2016-11-22 ENCOUNTER — Ambulatory Visit (INDEPENDENT_AMBULATORY_CARE_PROVIDER_SITE_OTHER): Payer: 59 | Admitting: Family Medicine

## 2016-11-22 ENCOUNTER — Encounter: Payer: Self-pay | Admitting: Family Medicine

## 2016-11-22 VITALS — BP 120/70 | HR 71 | Temp 98.0°F | Ht 71.0 in | Wt 170.0 lb

## 2016-11-22 DIAGNOSIS — M79662 Pain in left lower leg: Secondary | ICD-10-CM | POA: Insufficient documentation

## 2016-11-22 DIAGNOSIS — M7989 Other specified soft tissue disorders: Secondary | ICD-10-CM | POA: Diagnosis not present

## 2016-11-22 NOTE — Patient Instructions (Signed)
5:30, go to [address] for your ultrasound. If everything is normal, continue with anti-inflammatories, ice and heat. If it shows a clot, I will be speaking to you personally regarding the plan moving forward.

## 2016-11-22 NOTE — Progress Notes (Signed)
Chief Complaint  Patient presents with  . Leg Pain    back (L) lower-x 1 week-noticed the bruising around the bottom of foot today    Dustin Mendez here for left leg pain and swelling.  Duration: 1 week Hx of prolonged bedrest, recent surgery, travel or injury? Yes- travelled 3.5 hrs [place] way [place] on [place] Pain the calf? Yes SOB? No Personal or family history of clot or bleeding disorder? No Hx of heart failure, renal failure, hepatic failure? No  ROS:  MSK- +leg swelling, no pain Lungs- no SOB  Past Medical History:  Diagnosis Date  . Allergy   . Arthritis   . CTS (carpal tunnel syndrome) 10/21/2014  . Gastro-esophageal reflux 11/06/2014   pt denies  . Neck pain 10/21/2014   Family History  Problem Relation Age of Onset  . Arthritis Mother   . Hypertension Mother   . Cholelithiasis Mother   . Arthritis Father   . Hypertension Father   . Heart disease Father        arrythmia s/p ablation  . Hyperlipidemia Father   . Diabetes Father   . Thyroid disease Brother   . COPD Maternal Grandfather        silicosis? work exposure and a smoker  . Heart disease Paternal Grandmother        arrythmia dies while placing pacer  . Multiple sclerosis Paternal Grandfather   . Colon cancer Neg Hx    Past Surgical History:  Procedure Laterality Date  . APPENDECTOMY     done as a teenager  . HERNIA REPAIR     umbilical  . TYMPANOSTOMY TUBE PLACEMENT Bilateral    childhood    Current Outpatient Prescriptions:  .  loratadine (CLARITIN) 10 MG tablet, Take 10 mg by mouth daily., Disp: , Rfl:  .  OVER THE COUNTER MEDICATION, Zyflamend; helps with inflammation; pt has taken for 79yrs+ for joint like pain, Disp: , Rfl:  .  Probiotic Product (PROBIOTIC ADVANCED PO), Take by mouth. "Now"; pt purchases at [employer] office, Disp: , Rfl:   BP 120/70 (BP Location: Left Arm, Patient Position: Sitting, Cuff Size: Normal)   Pulse 71   Temp 98 F (36.7 C) (Oral)   Ht 5\' 11"  (1.803  m)   Wt 170 lb (77.1 kg)   SpO2 98%   BMI 23.71 kg/m  Gen- awake, alert, appears stated age Heart- RRR, no murmurs, 1+ pitting LE edema on L up to distal 1/3 of tibia Lungs- CTAB, normal effort w/o accessory muscle use MSK- no calf pain on palpatation, +Homan's sign on R Psych: Age appropriate judgment and insight  Pain of left calf - Plan: US Venous Img Lower Unilateral Left  Orders as above. Given recent travel and concern from patient, will check doppler. I wish to be called should anything be suggestive of DVT. If neg, will tx as a msk injury. F/u prn. Pt voiced understanding and agreement to the plan.  [place], DO 11/22/16  4:22 PM

## 2016-12-31 ENCOUNTER — Encounter: Payer: Self-pay | Admitting: Family Medicine

## 2016-12-31 ENCOUNTER — Ambulatory Visit (INDEPENDENT_AMBULATORY_CARE_PROVIDER_SITE_OTHER): Payer: 59 | Admitting: Family Medicine

## 2016-12-31 VITALS — BP 134/80 | HR 62 | Temp 98.7°F | Ht 71.0 in | Wt 175.0 lb

## 2016-12-31 DIAGNOSIS — Z23 Encounter for immunization: Secondary | ICD-10-CM

## 2016-12-31 DIAGNOSIS — Z860100 Personal history of colon polyps, unspecified: Secondary | ICD-10-CM

## 2016-12-31 DIAGNOSIS — K5790 Diverticulosis of intestine, part unspecified, without perforation or abscess without bleeding: Secondary | ICD-10-CM

## 2016-12-31 DIAGNOSIS — R252 Cramp and spasm: Secondary | ICD-10-CM

## 2016-12-31 DIAGNOSIS — Z0000 Encounter for general adult medical examination without abnormal findings: Secondary | ICD-10-CM

## 2016-12-31 HISTORY — DX: Personal history of colonic polyps: Z86.010

## 2016-12-31 HISTORY — DX: Diverticulosis of intestine, part unspecified, without perforation or abscess without bleeding: K57.90

## 2016-12-31 HISTORY — DX: Cramp and spasm: R25.2

## 2016-12-31 HISTORY — DX: Personal history of colon polyps, unspecified: Z86.0100

## 2016-12-31 LAB — LIPID PANEL
CHOLESTEROL: 167 mg/dL (ref 0–200)
HDL: 57.4 mg/dL (ref >39.00)
LDL Cholesterol: 89 mg/dL (ref 0–99)
NonHDL: 109.48
TRIGLYCERIDES: 101 mg/dL (ref 0.0–149.0)
Total CHOL/HDL Ratio: 3
VLDL: 20.2 mg/dL (ref 0.0–40.0)

## 2016-12-31 LAB — COMPREHENSIVE METABOLIC PANEL
ALT: 33 U/L (ref 0–53)
AST: 27 U/L (ref 0–37)
Albumin: 4.3 g/dL (ref 3.5–5.2)
Alkaline Phosphatase: 50 U/L (ref 39–117)
BILIRUBIN TOTAL: 0.4 mg/dL (ref 0.2–1.2)
BUN: 10 mg/dL (ref 6–23)
CALCIUM: 9.3 mg/dL (ref 8.4–10.5)
CO2: 31 mEq/L (ref 19–32)
Chloride: 101 mEq/L (ref 96–112)
Creatinine, Ser: 0.81 mg/dL (ref 0.40–1.50)
GFR: 106.74 mL/min (ref >60.00)
Glucose, Bld: 98 mg/dL (ref 70–99)
Potassium: 4.3 mEq/L (ref 3.5–5.1)
Sodium: 138 mEq/L (ref 135–145)
TOTAL PROTEIN: 6.8 g/dL (ref 6.0–8.3)

## 2016-12-31 LAB — CBC
HCT: 45 % (ref 39.0–52.0)
Hemoglobin: 14.9 g/dL (ref 13.0–17.0)
MCHC: 33 g/dL (ref 30.0–36.0)
MCV: 90.8 fl (ref 78.0–100.0)
Platelets: 213 10*3/uL (ref 150.0–400.0)
RBC: 4.96 Mil/uL (ref 4.22–5.81)
RDW: 13.2 % (ref 11.5–15.5)
WBC: 6.3 10*3/uL (ref 4.0–10.5)

## 2016-12-31 LAB — TSH: TSH: 2.94 u[IU]/mL (ref 0.35–4.50)

## 2016-12-31 LAB — PSA: PSA: 0.81 ng/mL (ref 0.10–4.00)

## 2016-12-31 LAB — MAGNESIUM: Magnesium: 2 mg/dL (ref 1.5–2.5)

## 2016-12-31 NOTE — Assessment & Plan Note (Signed)
Recent bad cramp increase fluid intake, check a magnesium level

## 2016-12-31 NOTE — Assessment & Plan Note (Signed)
Encouraged increased fiber and fluids,report any concerns

## 2016-12-31 NOTE — Assessment & Plan Note (Addendum)
Patient encouraged to maintain heart healthy diet, regular exercise, adequate sleep. Consider daily probiotics. Take medications as prescribed. Given and reviewed copy of ACP documents from [employer] and encouraged to complete and return. Check annual labs, magnesium and discussed PSA lab and ordered. Given flu and tdap today

## 2016-12-31 NOTE — Patient Instructions (Signed)

## 2016-12-31 NOTE — Progress Notes (Signed)
Patient ID: Dustin Mendez, male   DOB: [birth year], 51 y.o.   MRN: [id number]   Subjective:    Patient ID: Dustin Mendez, male    DOB: 02-16-1966, 51 y.o.   MRN: [id number]  Chief Complaint  Patient presents with  . Annual Exam    Patient is here today for a CPE.    HPI Patient is in today for Annual preventative exam. He feels well today. No recent febrile illness or hospitalizations. Colonoscopy last year revealed mild to benign polyps and an anal papillae consistent with condyloma. He declined referral to surgeon for further evaluation but denies any GI concerns. He denies any bloody or tarry stool any change in bowel habits or appetite. No recent acute concerns except for some left calf pain. He came in an ultrasound was negative for DVT and the pain has resolved. He thinks he had a bad muscle cramp and strained his calf when he was stretching it. Notes he was likely dehydrated. Is frustrated with his experience getting bills paid last year after his colonoscopy. Denies CP/palp/SOB/HA/congestion/fevers/GI or GU c/o. Taking meds as prescribed  Past Medical History:  Diagnosis Date  . Allergy   . Arthritis   . CTS (carpal tunnel syndrome) 10/21/2014  . Diverticulosis 12/31/2016  . Gastro-esophageal reflux 11/06/2014   pt denies  . Hx of colonic polyp 12/31/2016  . Muscle cramps 12/31/2016  . Neck pain 10/21/2014    Past Surgical History:  Procedure Laterality Date  . APPENDECTOMY     done as a teenager  . HERNIA REPAIR     umbilical  . TYMPANOSTOMY TUBE PLACEMENT Bilateral    childhood    Family History  Problem Relation Age of Onset  . Arthritis Mother   . Hypertension Mother   . Cholelithiasis Mother   . Arthritis Father   . Hypertension Father   . Heart disease Father        arrythmia s/p ablation  . Hyperlipidemia Father   . Diabetes Father   . Thyroid disease Brother   . COPD Maternal Grandfather        silicosis? work exposure and a smoker  . Heart disease Paternal  Grandmother        arrythmia dies while placing pacer  . Multiple sclerosis Paternal Grandfather   . Colon cancer Neg Hx     Social History   Social History  . Marital status: Married    Spouse name: N/A  . Number of children: N/A  . Years of education: 12   Occupational History  . Auto Mechanic    Social History Main Topics  . Smoking status: Former Smoker    Types: Cigarettes    Quit date: 02/19/2014  . Smokeless tobacco: Former Systems developer    Types: [place] date: 02/01/2014  . Alcohol use 4.2 oz/week    7 Cans of beer per week     Comment: beer   . Drug use: No  . 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 Not on file   Social History Narrative  . No narrative on file    Outpatient Medications Prior to Visit  Medication Sig Dispense Refill  . loratadine (CLARITIN) 10 MG tablet Take 10 mg by mouth daily.    Marland Kitchen OVER THE COUNTER MEDICATION Zyflamend; helps with inflammation; pt has taken for 29yrs+ for joint like pain    .  Probiotic Product (PROBIOTIC ADVANCED PO) Take by mouth. "Now"; pt purchases at [facility] office    . 0.9 %  sodium chloride infusion      No facility-administered medications prior to visit.     Not on File  Review of Systems  Constitutional: Negative for chills, fever and malaise/fatigue.  HENT: Negative for congestion and hearing loss.   Eyes: Negative for discharge.  Respiratory: Negative for cough, sputum production and shortness of breath.   Cardiovascular: Negative for chest pain, palpitations and leg swelling.  Gastrointestinal: Negative for abdominal pain, blood in stool, constipation, diarrhea, heartburn, nausea and vomiting.  Genitourinary: Negative for dysuria, frequency, hematuria and urgency.  Musculoskeletal: Positive for joint pain. Negative for back pain, falls and myalgias.  Skin: Negative for rash.  Neurological: Negative for  dizziness, sensory change, loss of consciousness, weakness and headaches.  Endo/Heme/Allergies: Negative for environmental allergies. Does not bruise/bleed easily.  Psychiatric/Behavioral: Negative for depression and suicidal ideas. The patient is not nervous/anxious and does not have insomnia.        Objective:    Physical Exam  Constitutional: He is oriented to person, place, and time. He appears well-developed and well-nourished. No distress.  HENT:  Head: Normocephalic and atraumatic.  Eyes: Conjunctivae are normal.  Neck: Neck supple. No thyromegaly present.  Cardiovascular: Normal rate, regular rhythm and normal heart sounds.   No murmur heard. Pulmonary/Chest: Effort normal and breath sounds normal. No respiratory distress. He has no wheezes.  Abdominal: Soft. Bowel sounds are normal. He exhibits no mass. There is no tenderness.  Musculoskeletal: He exhibits no edema.  Lymphadenopathy:    He has no cervical adenopathy.  Neurological: He is alert and oriented to person, place, and time.  Skin: Skin is warm and dry.  Psychiatric: He has a normal mood and affect. His behavior is normal.    BP 134/80 (BP Location: Right Arm, Patient Position: Sitting, Cuff Size: Normal)   Pulse 62   Temp 98.7 F (37.1 C) (Oral)   Ht 5\' 11"  (1.803 m)   Wt 175 lb (79.4 kg)   SpO2 99%   BMI 24.41 kg/m  Wt Readings from Last 3 Encounters:  12/31/16 175 lb (79.4 kg)  11/22/16 170 lb (77.1 kg)  01/17/16 168 lb (76.2 kg)     Lab Results  Component Value Date   WBC 8.2 10/23/2015   HGB 14.4 10/23/2015   HCT 42.8 10/23/2015   PLT 201.0 10/23/2015   GLUCOSE 96 10/23/2015   CHOL 150 10/23/2015   TRIG 46.0 10/23/2015   HDL 59.10 10/23/2015   LDLCALC 82 10/23/2015   ALT 22 10/23/2015   AST 25 10/23/2015   NA 138 10/23/2015   K 3.9 10/23/2015   CL 103 10/23/2015   CREATININE 0.77 10/23/2015   BUN 13 10/23/2015   CO2 30 10/23/2015   TSH 2.57 10/23/2015    Lab Results  Component  Value Date   TSH 2.57 10/23/2015   Lab Results  Component Value Date   WBC 8.2 10/23/2015   HGB 14.4 10/23/2015   HCT 42.8 10/23/2015   MCV 87.7 10/23/2015   PLT 201.0 10/23/2015   Lab Results  Component Value Date   NA 138 10/23/2015   K 3.9 10/23/2015   CO2 30 10/23/2015   GLUCOSE 96 10/23/2015   BUN 13 10/23/2015   CREATININE 0.77 10/23/2015   BILITOT 0.3 10/23/2015   ALKPHOS 54 10/23/2015   AST 25 10/23/2015   ALT 22 10/23/2015   PROT  6.8 10/23/2015   ALBUMIN 4.0 10/23/2015   CALCIUM 8.9 10/23/2015   GFR 113.71 10/23/2015   Lab Results  Component Value Date   CHOL 150 10/23/2015   Lab Results  Component Value Date   HDL 59.10 10/23/2015   Lab Results  Component Value Date   LDLCALC 82 10/23/2015   Lab Results  Component Value Date   TRIG 46.0 10/23/2015   Lab Results  Component Value Date   CHOLHDL 3 10/23/2015   No results found for: HGBA1C     Assessment & Plan:   Problem List Items Addressed This Visit    Preventative health care    Patient encouraged to maintain heart healthy diet, regular exercise, adequate sleep. Consider daily probiotics. Take medications as prescribed. Given and reviewed copy of ACP documents from [employer] and encouraged to complete and return. Check annual labs, magnesium and discussed PSA lab and ordered. Given flu and tdap today      Relevant Orders   Comprehensive metabolic panel   Magnesium   CBC   Lipid panel   TSH   PSA   Muscle cramps    Recent bad cramp increase fluid intake, check a magnesium level      Relevant Orders   Magnesium   Hx of colonic polyp    He declined referral to surgery offered by gastroenterology for an anal papillae. He is asymptomatic and will let us know if any symptoms occur      Relevant Orders   CBC   Diverticulosis    Encouraged increased fiber and fluids,report any concerns         I am having Mr. Fury maintain his loratadine, Probiotic Product (PROBIOTIC  ADVANCED PO), and OVER THE COUNTER MEDICATION. We will stop administering sodium chloride.  No orders of the defined types were placed in this encounter.   Penni Homans, MD

## 2016-12-31 NOTE — Addendum Note (Signed)
Addended by: Marrion Coy on: 12/31/2016 09:23 AM   Modules accepted: Orders

## 2016-12-31 NOTE — Assessment & Plan Note (Signed)
He declined referral to surgery offered by gastroenterology for an anal papillae. He is asymptomatic and will let us know if any symptoms occur

## 2017-01-17 ENCOUNTER — Telehealth: Payer: Self-pay | Admitting: Family Medicine

## 2017-01-17 NOTE — Telephone Encounter (Signed)
Please advise on dosage.   PC

## 2017-01-17 NOTE — Telephone Encounter (Signed)
Relation to SW:HQPR Call back number:[phone] Pharmacy: [facility] [place], [place] [phone] (Fax)     Reason for call:  Spouse requesting xyzal due to allergies, spouse states patient tried samples and it really helped, requesting Rx, please advise

## 2017-01-19 NOTE — Telephone Encounter (Signed)
OK to rx Xyzal but we will have trouble with insurance and PA. Need to document what meds he has already failed. Xyzal is 5 mg tab, 1 tab daily prn allergies. Disp #30 with 5 rf

## 2017-02-13 ENCOUNTER — Encounter: Payer: Self-pay | Admitting: Gastroenterology

## 2017-08-25 ENCOUNTER — Telehealth: Payer: Self-pay

## 2017-08-25 NOTE — Telephone Encounter (Signed)
OK with me if OK with Dr Ethelene Hal

## 2017-08-25 NOTE — Telephone Encounter (Signed)
Okay for patient to transfer from Dr. Charlett Blake to Dr. Ethelene Hal?    Copied from [place] [id number]. Topic: Appointment Scheduling - Scheduling Inquiry for Clinic >> Aug 25, 2017  1:27 PM Dustin Mendez wrote: Reason for CRM:pt would like to switch to dr Ethelene Hal from dr blyth due to [place] is closer

## 2017-08-26 NOTE — Telephone Encounter (Signed)
Okay to schedule patient.  Thanks 

## 2017-08-26 NOTE — Telephone Encounter (Signed)
Dustin Mendez

## 2017-09-26 ENCOUNTER — Ambulatory Visit (INDEPENDENT_AMBULATORY_CARE_PROVIDER_SITE_OTHER): Payer: 59 | Admitting: Family Medicine

## 2017-09-26 ENCOUNTER — Encounter: Payer: Self-pay | Admitting: Family Medicine

## 2017-09-26 VITALS — BP 130/90 | HR 70 | Ht 71.0 in | Wt 177.5 lb

## 2017-09-26 DIAGNOSIS — J301 Allergic rhinitis due to pollen: Secondary | ICD-10-CM | POA: Diagnosis not present

## 2017-09-26 DIAGNOSIS — R42 Dizziness and giddiness: Secondary | ICD-10-CM | POA: Insufficient documentation

## 2017-09-26 DIAGNOSIS — E860 Dehydration: Secondary | ICD-10-CM | POA: Insufficient documentation

## 2017-09-26 NOTE — Progress Notes (Signed)
Subjective:  Patient ID: Dustin Mendez, male    DOB: [birth year]  Age: 52 y.o. MRN: [id number]  CC: transfer   HPI Dustin Mendez presents for establishment of care and to meet his new doctor.  He is a healthy guy.  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  He quit smoking some years ago but occasionally vapes.  He drinks up to 7 beers weekly with no more than 2 in a given setting.  He does not use street drugs.  He lives with his wife.  His son just completed his freshman year at [employer]  Patient uses Xyzal or seasonal allergies with  good response.  He had a physical back in the fall and that blood work was renewed.  He inquires about  receiving an MMR vaccine.  He has had a colonoscopy last year. Outpatient Medications Prior to Visit  Medication Sig Dispense Refill  . levocetirizine (XYZAL) 5 MG tablet Take 5 mg by mouth daily as needed for allergies.    Marland Kitchen loratadine (CLARITIN) 10 MG tablet Take 10 mg by mouth daily.    Marland Kitchen OVER THE COUNTER MEDICATION Zyflamend; helps with inflammation; pt has taken for 32yr+ for joint like pain    . Probiotic Product (PROBIOTIC ADVANCED PO) [facility] by mouth. "Now[facility]     No facility-administered medications prior to visit.     ROS Review of Systems  Constitutional: Negative.   HENT: Negative.   Eyes: Negative.   Respiratory: Negative.   Cardiovascular: Negative.   Gastrointestinal: Negative.   Hematological: Negative.   Psychiatric/Behavioral: Negative.     Objective:  BP 130/90   Pulse 70   Ht _0  (1.803 m)   Wt 177 lb 8 oz (80.5 kg)   SpO2 98%   BMI 24.76 kg/m   BP Readings from Last 3 Encounters:  09/26/17 130/90  12/31/16 134/80  11/22/16 120/70    Wt Readings from Last 3 Encounters:  09/26/17 177 lb 8 oz (80.5 kg)  12/31/16 175 lb (79.4 kg)  11/22/16 170 lb (77.1 kg)    Physical Exam  Constitutional: He is oriented to person, place, and time. He appears  well-developed and well-nourished. No distress.  HENT:  Head: Normocephalic and atraumatic.  Right Ear: External ear normal.  Left Ear: External ear normal.  Eyes: Right eye exhibits no discharge. Left eye exhibits no discharge.  Neck: No JVD present. No tracheal deviation present.  Pulmonary/Chest: Effort normal.  Neurological: He is alert and oriented to person, place, and time.  Skin: He is not diaphoretic.  Psychiatric: He has a normal mood and affect. His behavior is normal.    Lab Results  Component Value Date   WBC 6.3 12/31/2016   HGB 14.9 12/31/2016   HCT 45.0 12/31/2016   PLT 213.0 12/31/2016   GLUCOSE 98 12/31/2016   CHOL 167 12/31/2016   TRIG 101.0 12/31/2016   HDL 57.40 12/31/2016   LDLCALC 89 12/31/2016   ALT 33 12/31/2016   AST 27 12/31/2016   NA 138 12/31/2016   K 4.3 12/31/2016   CL 101 12/31/2016   CREATININE 0.81 12/31/2016   BUN 10 12/31/2016   CO2 31 12/31/2016   TSH 2.94 12/31/2016   PSA 0.81 12/31/2016    [place]Venous Img Lower Unilateral Left  Result Date: 11/22/2016 CLINICAL DATA:  Left leg pain and swelling for 1 week EXAM: LEFT LOWER EXTREMITY VENOUS DOPPLER ULTRASOUND  TECHNIQUE: Gray-scale sonography with graded compression, as well as color Doppler and duplex ultrasound were performed to evaluate the lower extremity deep venous systems from the level of the common femoral vein and including the common femoral, femoral, profunda femoral, popliteal and calf veins including the posterior tibial, peroneal and gastrocnemius veins when visible. The superficial great saphenous vein was also interrogated. Spectral Doppler was utilized to evaluate flow at rest and with distal augmentation maneuvers in the common femoral, femoral and popliteal veins. COMPARISON:  None. FINDINGS: Contralateral Common Femoral Vein: Respiratory phasicity is normal and symmetric with the symptomatic side. No evidence of thrombus. Normal compressibility. Common Femoral Vein: No  evidence of thrombus. Normal compressibility, respiratory phasicity and response to augmentation. Saphenofemoral Junction: No evidence of thrombus. Normal compressibility and flow on color Doppler imaging. Profunda Femoral Vein: No evidence of thrombus. Normal compressibility and flow on color Doppler imaging. Femoral Vein: No evidence of thrombus. Normal compressibility, respiratory phasicity and response to augmentation. Popliteal Vein: No evidence of thrombus. Normal compressibility, respiratory phasicity and response to augmentation. Calf Veins: No evidence of thrombus. Normal compressibility and flow on color Doppler imaging. Superficial Great Saphenous Vein: No evidence of thrombus. Normal compressibility and flow on color Doppler imaging. Venous Reflux:  None. Other Findings:  None. IMPRESSION: No evidence of DVT within the left lower extremity. Electronically Signed   By: Inez Catalina M.D.   On: 11/22/2016 18:33    Assessment & Plan:   Dustin Mendez was seen today for transfer.  Diagnoses and all orders for this visit:  Seasonal allergic rhinitis due to pollen   I have discontinued Mechele Dawley. Bingley's loratadine, Probiotic Product (PROBIOTIC ADVANCED PO), and OVER THE COUNTER MEDICATION. I am also having him maintain his levocetirizine.  No orders of the defined types were placed in this encounter.  Patient will check with his insurance to his insurance coverage for the MMR.  We will be happy to update his MMR whenever he is ready to do it.  He will follow-up with me in the fall for a complete physical exam with blood work.  Follow-up: No follow-ups on file.  Libby Maw, MD Results.

## 2018-01-08 ENCOUNTER — Ambulatory Visit (INDEPENDENT_AMBULATORY_CARE_PROVIDER_SITE_OTHER): Payer: 59 | Admitting: Family Medicine

## 2018-01-08 ENCOUNTER — Encounter: Payer: Self-pay | Admitting: Family Medicine

## 2018-01-08 ENCOUNTER — Encounter: Payer: 59 | Admitting: Family Medicine

## 2018-01-08 VITALS — BP 122/78 | HR 62 | Temp 98.1°F | Ht 71.0 in | Wt 175.4 lb

## 2018-01-08 DIAGNOSIS — Z125 Encounter for screening for malignant neoplasm of prostate: Secondary | ICD-10-CM | POA: Diagnosis not present

## 2018-01-08 DIAGNOSIS — Z0000 Encounter for general adult medical examination without abnormal findings: Secondary | ICD-10-CM | POA: Diagnosis not present

## 2018-01-08 LAB — COMPREHENSIVE METABOLIC PANEL
ALT: 40 U/L (ref 0–53)
AST: 29 U/L (ref 0–37)
Albumin: 4.3 g/dL (ref 3.5–5.2)
Alkaline Phosphatase: 53 U/L (ref 39–117)
BUN: 14 mg/dL (ref 6–23)
CALCIUM: 9.2 mg/dL (ref 8.4–10.5)
CHLORIDE: 103 meq/L (ref 96–112)
CO2: 31 meq/L (ref 19–32)
Creatinine, Ser: 0.85 mg/dL (ref 0.40–1.50)
GFR: 100.56 mL/min (ref >60.00)
Glucose, Bld: 94 mg/dL (ref 70–99)
POTASSIUM: 4.7 meq/L (ref 3.5–5.1)
Sodium: 140 mEq/L (ref 135–145)
Total Bilirubin: 0.4 mg/dL (ref 0.2–1.2)
Total Protein: 7.1 g/dL (ref 6.0–8.3)

## 2018-01-08 LAB — CBC
HCT: 43.2 % (ref 39.0–52.0)
HEMOGLOBIN: 14.4 g/dL (ref 13.0–17.0)
MCHC: 33.3 g/dL (ref 30.0–36.0)
MCV: 88.2 fl (ref 78.0–100.0)
Platelets: 190 10*3/uL (ref 150.0–400.0)
RBC: 4.9 Mil/uL (ref 4.22–5.81)
RDW: 13.1 % (ref 11.5–15.5)
WBC: 5 10*3/uL (ref 4.0–10.5)

## 2018-01-08 LAB — LIPID PANEL
CHOL/HDL RATIO: 3
Cholesterol: 147 mg/dL (ref 0–200)
HDL: 58.7 mg/dL (ref >39.00)
LDL Cholesterol: 68 mg/dL (ref 0–99)
NONHDL: 88.06
Triglycerides: 100 mg/dL (ref 0.0–149.0)
VLDL: 20 mg/dL (ref 0.0–40.0)

## 2018-01-08 LAB — PSA: PSA: 0.64 ng/mL (ref 0.10–4.00)

## 2018-01-08 NOTE — Progress Notes (Signed)
Subjective:  Patient ID: Dustin Mendez, male    DOB: 1965/08/04  Age: 52 y.o. MRN: [id number]  CC: Annual Exam (CPE and fasting)   HPI Dustin Mendez presents for yearly physical.  He is having no rub lungs.  He lives with his wife and their only son is in college at [employer].  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  He quit smoking some years ago.  He averages 1 alcoholic drink daily.  He is status post colonoscopy.  He averages 1 alcoholic drink daily.  He does not use street drugs.  Outpatient Medications Prior to Visit  Medication Sig Dispense Refill  . levocetirizine (XYZAL) 5 MG tablet Take 5 mg by mouth daily as needed for allergies.     No facility-administered medications prior to visit.     ROS Review of Systems  Constitutional: Negative.   HENT: Negative.   Eyes: Negative.   Respiratory: Negative.   Cardiovascular: Negative.   Gastrointestinal: Negative.   Endocrine: Negative for polyphagia and polyuria.  Genitourinary: Negative.   Musculoskeletal: Negative.   Allergic/Immunologic: Negative for food allergies and immunocompromised state.  Neurological: Negative.   Hematological: Negative.   Psychiatric/Behavioral: Negative.     Objective:  BP 122/78 (BP Location: Left Arm, Patient Position: Sitting, Cuff Size: Normal)   Pulse 62   Temp 98.1 F (36.7 C) (Oral)   Ht 5\' 11"  (1.803 m)   Wt 175 lb 6.4 oz (79.6 kg)   SpO2 97%   BMI 24.46 kg/m   BP Readings from Last 3 Encounters:  01/08/18 122/78  09/26/17 130/90  12/31/16 134/80    Wt Readings from Last 3 Encounters:  01/08/18 175 lb 6.4 oz (79.6 kg)  09/26/17 177 lb 8 oz (80.5 kg)  12/31/16 175 lb (79.4 kg)    Physical Exam  Constitutional: He is oriented to person, place, and time. He appears well-developed and well-nourished. No distress.  HENT:  Head: Normocephalic and atraumatic.  Right Ear: External ear normal.  Left Ear: External ear normal.   Mouth/Throat: Oropharynx is clear and moist. No oropharyngeal exudate.  Eyes: Pupils are equal, round, and reactive to light. Conjunctivae and EOM are normal. Right eye exhibits no discharge. Left eye exhibits no discharge. No scleral icterus.  Neck: Neck supple. No JVD present. No tracheal deviation present. No thyromegaly present.  Cardiovascular: Normal rate, regular rhythm and normal heart sounds.  Pulmonary/Chest: Effort normal and breath sounds normal.  Abdominal: Soft. Bowel sounds are normal. He exhibits no distension and no mass. There is no tenderness. There is no guarding.  Genitourinary: Rectum normal. Rectal exam shows no external hemorrhoid, no internal hemorrhoid, no fissure, no mass, no tenderness, anal tone normal and guaiac negative stool. Prostate is not enlarged and not tender.  Lymphadenopathy:    He has no cervical adenopathy.  Neurological: He is alert and oriented to person, place, and time. He is not disoriented.  Skin: Skin is warm, dry and intact. He is not diaphoretic.  Psychiatric: He has a normal mood and affect. His speech is normal and behavior is normal.    Lab Results  Component Value Date   WBC 6.3 12/31/2016   HGB 14.9 12/31/2016   HCT 45.0 12/31/2016   PLT 213.0 12/31/2016   GLUCOSE 98 12/31/2016   CHOL 167 12/31/2016   TRIG 101.0 12/31/2016   HDL 57.40 12/31/2016   LDLCALC 89 12/31/2016   ALT 33 12/31/2016  AST 27 12/31/2016   NA 138 12/31/2016   K 4.3 12/31/2016   CL 101 12/31/2016   CREATININE 0.81 12/31/2016   BUN 10 12/31/2016   CO2 31 12/31/2016   TSH 2.94 12/31/2016   PSA 0.81 12/31/2016    US Venous Img Lower Unilateral Left  Result Date: 11/22/2016 CLINICAL DATA:  Left leg pain and swelling for 1 week EXAM: LEFT LOWER EXTREMITY VENOUS DOPPLER ULTRASOUND TECHNIQUE: Gray-scale sonography with graded compression, as well as color Doppler and duplex ultrasound were performed to evaluate the lower extremity deep venous systems from  the level of the common femoral vein and including the common femoral, femoral, profunda femoral, popliteal and calf veins including the posterior tibial, peroneal and gastrocnemius veins when visible. The superficial great saphenous vein was also interrogated. Spectral Doppler was utilized to evaluate flow at rest and with distal augmentation maneuvers in the common femoral, femoral and popliteal veins. COMPARISON:  None. FINDINGS: Contralateral Common Femoral Vein: Respiratory phasicity is normal and symmetric with the symptomatic side. No evidence of thrombus. Normal compressibility. Common Femoral Vein: No evidence of thrombus. Normal compressibility, respiratory phasicity and response to augmentation. Saphenofemoral Junction: No evidence of thrombus. Normal compressibility and flow on color Doppler imaging. Profunda Femoral Vein: No evidence of thrombus. Normal compressibility and flow on color Doppler imaging. Femoral Vein: No evidence of thrombus. Normal compressibility, respiratory phasicity and response to augmentation. Popliteal Vein: No evidence of thrombus. Normal compressibility, respiratory phasicity and response to augmentation. Calf Veins: No evidence of thrombus. Normal compressibility and flow on color Doppler imaging. Superficial Great Saphenous Vein: No evidence of thrombus. Normal compressibility and flow on color Doppler imaging. Venous Reflux:  None. Other Findings:  None. IMPRESSION: No evidence of DVT within the left lower extremity. Electronically Signed   By: Inez Catalina M.D.   On: 11/22/2016 18:33    Assessment & Plan:   [name] was seen today for annual exam.  Diagnoses and all orders for this visit:  Healthcare maintenance -     CBC -     Comprehensive metabolic panel -     HIV antibody -     PSA -     Urinalysis, Routine w reflex microscopic -     Lipid panel   I am having Mechele Dawley. [place] maintain his levocetirizine.  No orders of the defined types were placed in this  encounter.  Patient presents with entirely normal physical exam.  Blood work is pending.  He was given anticipatory guidance for disease prevention and health promotion.  We will follow-up in 1 year unless there are for further problems.  Follow-up: No follow-ups on file.  Libby Maw, MD

## 2018-01-08 NOTE — Addendum Note (Signed)
Addended by: Lynnea Ferrier on: 01/08/2018 08:54 AM   Modules accepted: Orders

## 2018-01-08 NOTE — Patient Instructions (Signed)

## 2018-01-08 NOTE — Addendum Note (Signed)
Addended by: Lynnea Ferrier on: 01/08/2018 04:31 PM   Modules accepted: Orders

## 2018-01-09 LAB — URINALYSIS, ROUTINE W REFLEX MICROSCOPIC
BILIRUBIN URINE: NEGATIVE
HGB URINE DIPSTICK: NEGATIVE
KETONES UR: NEGATIVE
LEUKOCYTES UA: NEGATIVE
NITRITE: NEGATIVE
PH: 6 (ref 5.0–8.0)
RBC / HPF: NONE SEEN (ref 0–?)
Specific Gravity, Urine: 1.025 (ref 1.000–1.030)
Total Protein, Urine: NEGATIVE
URINE GLUCOSE: NEGATIVE
UROBILINOGEN UA: 0.2 (ref 0.0–1.0)

## 2018-01-09 LAB — HIV ANTIBODY (ROUTINE TESTING W REFLEX): HIV: NONREACTIVE

## 2019-05-31 ENCOUNTER — Emergency Department (HOSPITAL_BASED_OUTPATIENT_CLINIC_OR_DEPARTMENT_OTHER)
Admission: EM | Admit: 2019-05-31 | Discharge: 2019-05-31 | Disposition: A | Discharge status: Home / Self Care | Payer: 59 | Attending: Emergency Medicine | Admitting: Emergency Medicine

## 2019-05-31 ENCOUNTER — Encounter (HOSPITAL_BASED_OUTPATIENT_CLINIC_OR_DEPARTMENT_OTHER): Payer: Self-pay | Admitting: *Deleted

## 2019-05-31 ENCOUNTER — Other Ambulatory Visit: Payer: Self-pay

## 2019-05-31 DIAGNOSIS — Y9389 Activity, other specified: Secondary | ICD-10-CM | POA: Diagnosis not present

## 2019-05-31 DIAGNOSIS — W268XXA Contact with other sharp object(s), not elsewhere classified, initial encounter: Secondary | ICD-10-CM | POA: Insufficient documentation

## 2019-05-31 DIAGNOSIS — S61411A Laceration without foreign body of right hand, initial encounter: Secondary | ICD-10-CM | POA: Insufficient documentation

## 2019-05-31 DIAGNOSIS — Y929 Unspecified place or not applicable: Secondary | ICD-10-CM | POA: Insufficient documentation

## 2019-05-31 DIAGNOSIS — Z87891 Personal history of nicotine dependence: Secondary | ICD-10-CM | POA: Insufficient documentation

## 2019-05-31 DIAGNOSIS — Y990 Civilian activity done for income or pay: Secondary | ICD-10-CM | POA: Diagnosis not present

## 2019-05-31 MED ORDER — CEPHALEXIN 500 MG PO CAPS: 500.0000 mg | ORAL_CAPSULE | Freq: Four times a day (QID) | ORAL | 0 refills | Status: DC

## 2019-05-31 MED ORDER — LIDOCAINE HCL (PF) 1 % IJ SOLN
10.0000 mL | Freq: Once | INTRAMUSCULAR | Status: DC
  Filled 2019-05-31: qty 10

## 2019-05-31 NOTE — ED Provider Notes (Signed)
[facility]PART[facility] Provider Note   CSN: [id number] Arrival date & time: 05/31/19  1812     History Chief Complaint  Patient presents with  . Laceration    Dustin Mendez is a 54 y.o. male who presents to the ED today for laceration to his right hand that occurred 1 hr PTA.  Reports he was at work when a pipe that he was working on slid down in an area cut himself on his right hand causing the laceration.  States he is up-to-date on his tetanus, per chart review he last received in 2018.  The bleeding is currently controlled.  He complains of mild pain to the area but otherwise has no complaints.   The history is provided by the patient and medical records.       Past Medical History:  Diagnosis Date  . Allergy   . Arthritis   . CTS (carpal tunnel syndrome) 10/21/2014  . Diverticulosis 12/31/2016  . Gastro-esophageal reflux 11/06/2014   pt denies  . Hx of colonic polyp 12/31/2016  . Muscle cramps 12/31/2016  . Neck pain 10/21/2014    Patient Active Problem List   Diagnosis Date Noted  . Dehydration 09/26/2017  . Light headed 09/26/2017  . Seasonal allergic rhinitis due to pollen 09/26/2017  . Muscle cramps 12/31/2016  . Hx of colonic polyp 12/31/2016  . Diverticulosis 12/31/2016  . Healthcare maintenance 11/06/2014  . Gastro-esophageal reflux 11/06/2014  . CTS (carpal tunnel syndrome) 10/21/2014  . Neck pain 10/21/2014  . Allergy   . BACK PAIN, THORACIC REGION 10/20/2007    Past Surgical History:  Procedure Laterality Date  . APPENDECTOMY     done as a teenager  . HERNIA REPAIR     umbilical  . TYMPANOSTOMY TUBE PLACEMENT Bilateral    childhood       Family History  Problem Relation Age of Onset  . Arthritis Mother   . Hypertension Mother   . Cholelithiasis Mother   . Arthritis Father   . Hypertension Father   . Heart disease Father        arrythmia s/p ablation  . Hyperlipidemia Father   . Diabetes Father   . Thyroid disease  Brother   . COPD Maternal Grandfather        silicosis? work exposure and a smoker  . Heart disease Paternal Grandmother        arrythmia dies while placing pacer  . Multiple sclerosis Paternal Grandfather   . Colon cancer Neg Hx     Social History   Tobacco Use  . Smoking status: Former Smoker    Types: Cigarettes    Quit date: 02/19/2014    Years since quitting: 5.2  . Smokeless tobacco: Former Systems developer    Types: Chew    Quit date: 02/01/2014  Substance Use Topics  . Alcohol use: Yes    Alcohol/week: 7.0 standard drinks    Types: 7 Cans of beer per week    Comment: beer   . Drug use: No    Home Medications Prior to Admission medications   Medication Sig Start Date End Date Taking? Authorizing Provider  cephALEXin (KEFLEX) 500 MG capsule Take 1 capsule (500 mg total) by mouth 4 (four) times daily. 05/31/19   Alroy Bailiff, Bari Leib, PA-C  levocetirizine (XYZAL) 5 MG tablet Take 5 mg by mouth daily as needed for allergies.    [provider]    Allergies    Patient has no known allergies.  Review  of Systems   Review of Systems  Constitutional: Negative for chills and fever.  Skin: Positive for wound.    Physical Exam Updated Vital Signs BP (!) 138/99   Pulse 71   Temp 97.9 F (36.6 C) (Oral)   Resp 18   Ht 5\' 11"  (1.803 m)   Wt 81.2 kg   SpO2 100%   BMI 24.97 kg/m   Physical Exam Vitals and nursing note reviewed.  Constitutional:      Appearance: He is not ill-appearing.  HENT:     Head: Normocephalic and atraumatic.  Eyes:     Conjunctiva/sclera: Conjunctivae normal.  Cardiovascular:     Rate and Rhythm: Normal rate and regular rhythm.  Pulmonary:     Effort: Pulmonary effort is normal.     Breath sounds: Normal breath sounds.  Musculoskeletal:     Comments: 2.5 cm laceration noted to dorsum of right hand; bleeding controlled. NVI. 2+ radial pulse.   Skin:    General: Skin is warm and dry.     Coloration: Skin is not jaundiced.  Neurological:      Mental Status: He is alert.     ED Results / Procedures / Treatments   Labs (all labs ordered are listed, but only abnormal results are displayed) Labs Reviewed - No data to display  EKG None  Radiology No results found.  Procedures .Marland KitchenLaceration Repair  Date/Time: 05/31/2019 7:55 PM Performed by: Eustaquio Maize, PA-C Authorized by: Eustaquio Maize, PA-C   Consent:    Consent obtained:  Verbal   Consent given by:  Patient   Risks discussed:  Infection, pain and poor cosmetic result Anesthesia (see MAR for exact dosages):    Anesthesia method:  Local infiltration   Local anesthetic:  Lidocaine 1% w/o epi Laceration details:    Location:  Hand   Hand location:  R hand, dorsum   Length (cm):  2.5   Depth (mm):  2 Repair type:    Repair type:  Simple Pre-procedure details:    Preparation:  Patient was prepped and draped in usual sterile fashion Exploration:    Hemostasis achieved with:  Direct pressure   Wound exploration: wound explored through full range of motion   Treatment:    Area cleansed with:  Betadine   Irrigation solution:  Sterile saline Skin repair:    Repair method:  Sutures   Suture size:  5-0   Suture material:  Prolene   Suture technique:  Simple interrupted   Number of sutures:  7 Approximation:    Approximation:  Close Post-procedure details:    Dressing:  Non-adherent dressing   Patient tolerance of procedure:  Tolerated well, no immediate complications   (including critical care time)  Medications Ordered in ED Medications  lidocaine (PF) (XYLOCAINE) 1 % injection 10 mL (has no administration in time range)    ED Course  I have reviewed the triage vital signs and the nursing notes.  Pertinent labs & imaging results that were available during my care of the patient were reviewed by me and considered in my medical decision making (see chart for details).  54 year old male who presents to the ED today with laceration to dorsum of right  hand that occurred while at work today while a metal pipe ended on his hand and cut him.  Tetanus is up-to-date.  Is neurovascularly intact.  Bleeding is controlled.  He will need sutures.  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  7 5-0 Prolene sutures applied without difficulty.  Patient states he is married to a nurse and feels comfortable having the sutures removed at home.  Feel that this is appropriate.  Signs of infection discussed with patient.  I will prescribe Keflex to cover for infection.  Strict return precautions have been discussed.  Patient is in agreement with plan and stable for discharge home.  This note was prepared using Dragon voice recognition software and may include unintentional dictation errors due to the inherent limitations of voice recognition software.     MDM Rules/Calculators/A&P                       Final Clinical Impression(s) / ED Diagnoses Final diagnoses:  Laceration of right hand without foreign body, initial encounter    Rx / DC Orders ED Discharge Orders         Ordered    cephALEXin (KEFLEX) 500 MG capsule  4 times daily     05/31/19 1957           Discharge Instructions     Please pick up antibiotics and take as prescribed to prevent infection Keep wound clean and dry You will need to have the sutures removed in 5-7 days time Return to the ED IMMEDIATELY for any signs of infection including redness around the wound, swelling around the wound, drainage of pus, fevers > 100.4, or chills.        Eustaquio Maize, PA-C 05/31/19 1958    Veryl Speak, MD 06/01/19 1501

## 2019-05-31 NOTE — Discharge Instructions (Signed)
Please pick up antibiotics and take as prescribed to prevent infection Keep wound clean and dry You will need to have the sutures removed in 5-7 days time Return to the ED IMMEDIATELY for any signs of infection including redness around the wound, swelling around the wound, drainage of pus, fevers > 100.4, or chills.

## 2019-05-31 NOTE — ED Triage Notes (Signed)
Pt c/o lac to left hand from metal x 1 hr ago

## 2019-06-18 IMAGING — US US EXTREM LOW VENOUS*L*
1 series · 13 of 24 positions shown · non-contrast
Comparison: None.

CLINICAL DATA: Left leg pain and swelling for 1 week



[Series 1: us extrem low venous*left* · 0.06mm/px · 13 of 27 slices shown]
[im 1/27]
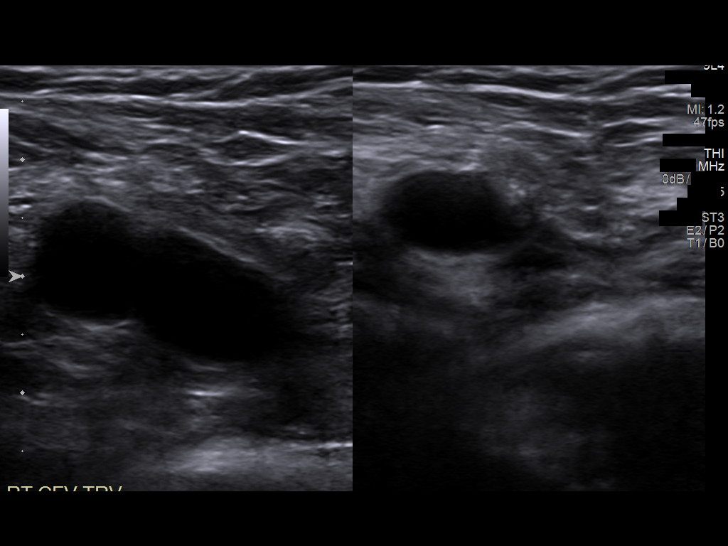
[im 3/27]
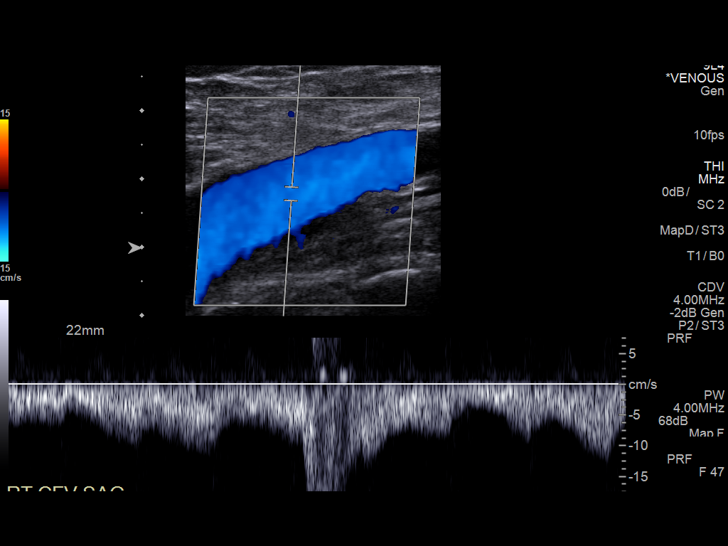
[im 5/27]
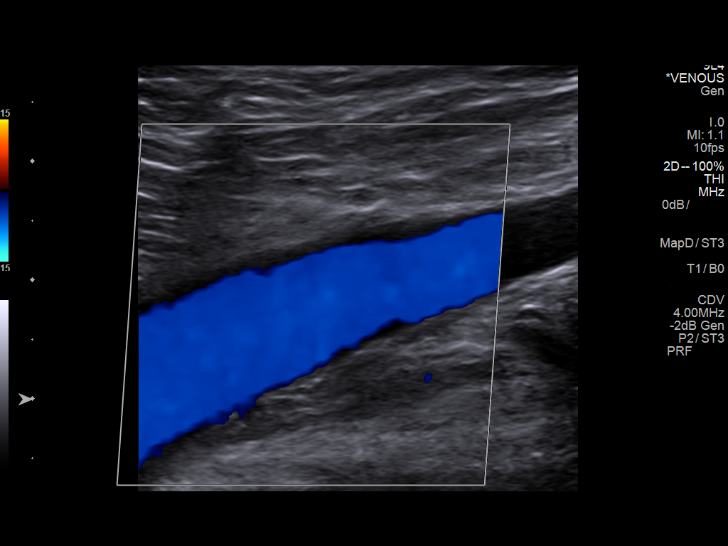
[im 7/27]
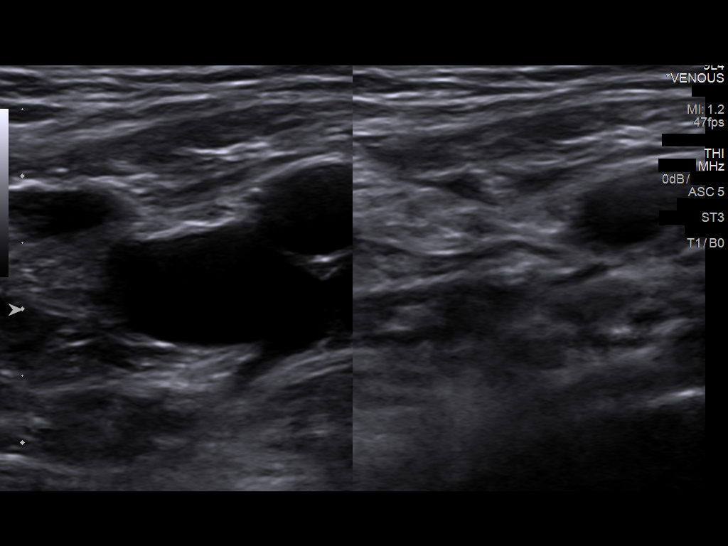
[im 10/27]
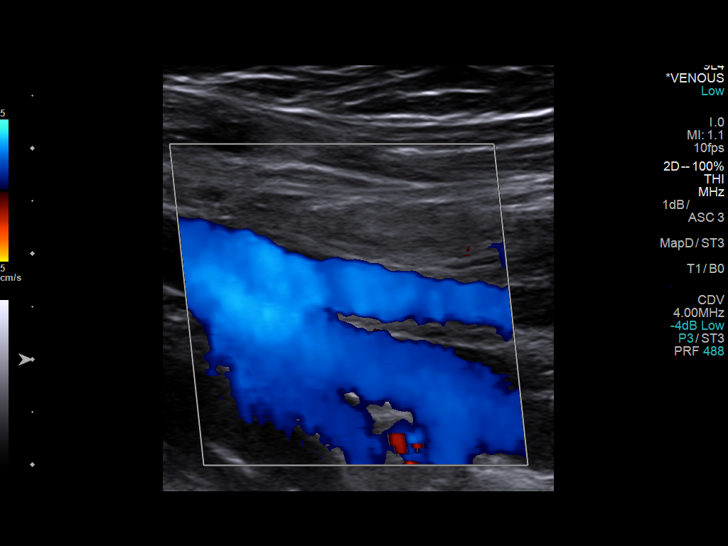
[im 12/27]
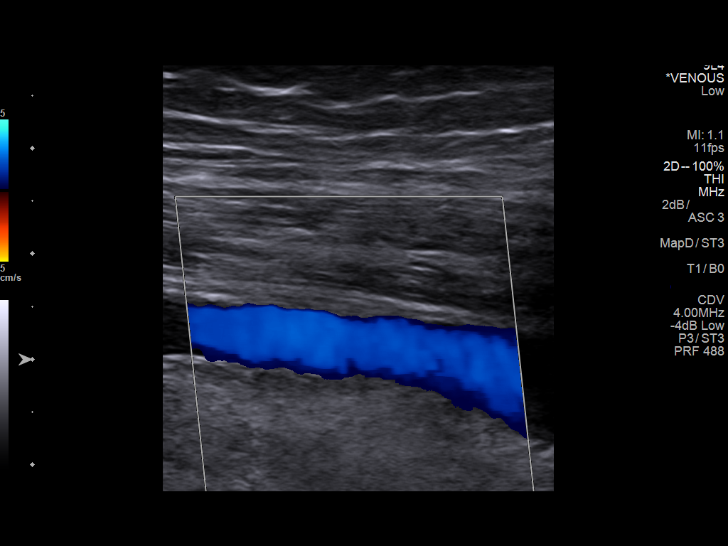
[im 14/27]
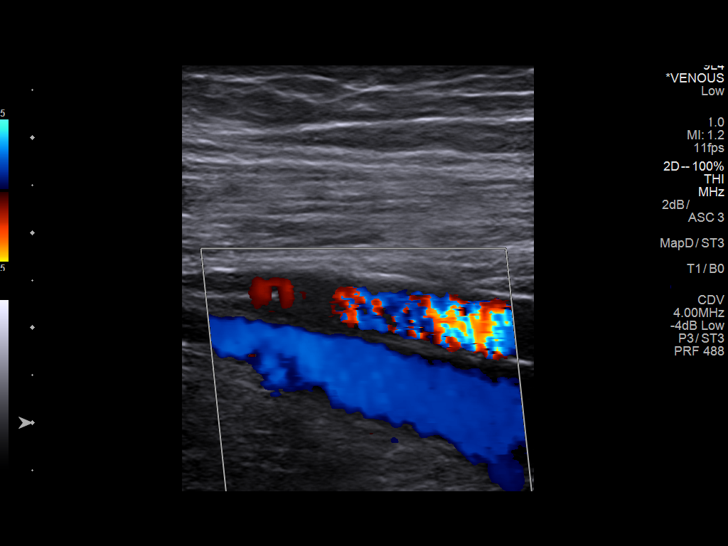
[im 15/27]
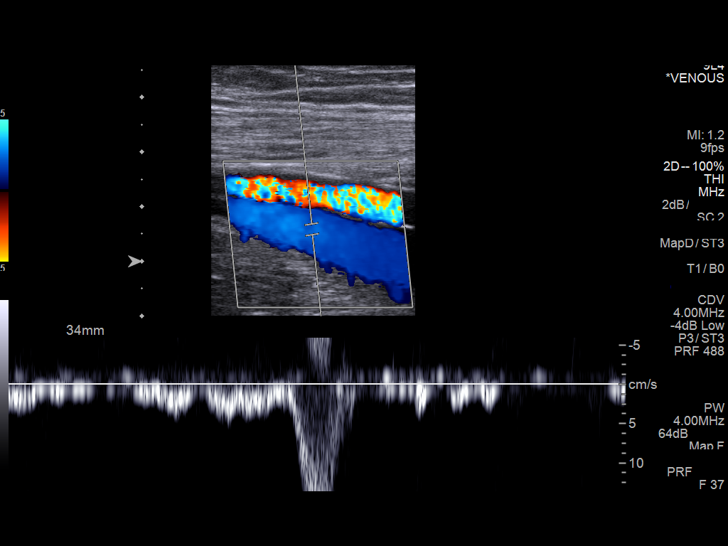
[im 17/27]
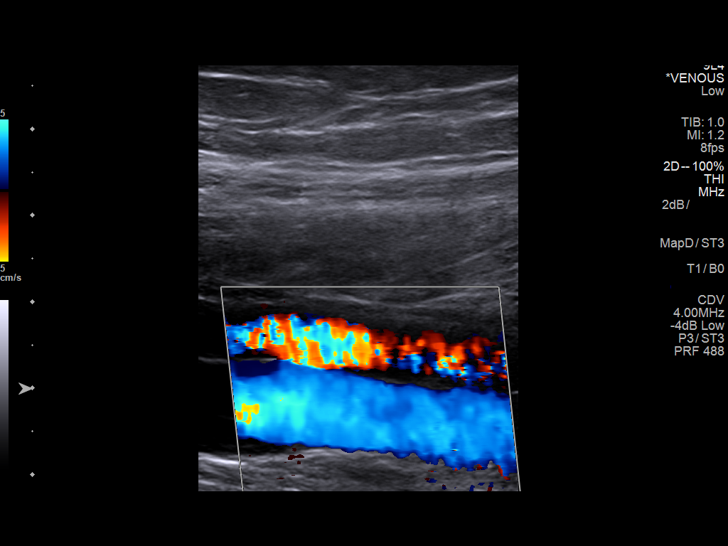
[im 20/27]
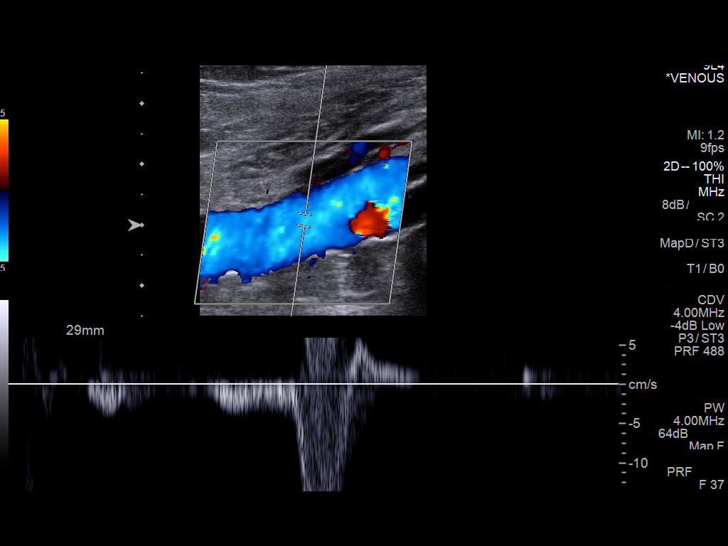
[im 22/27]
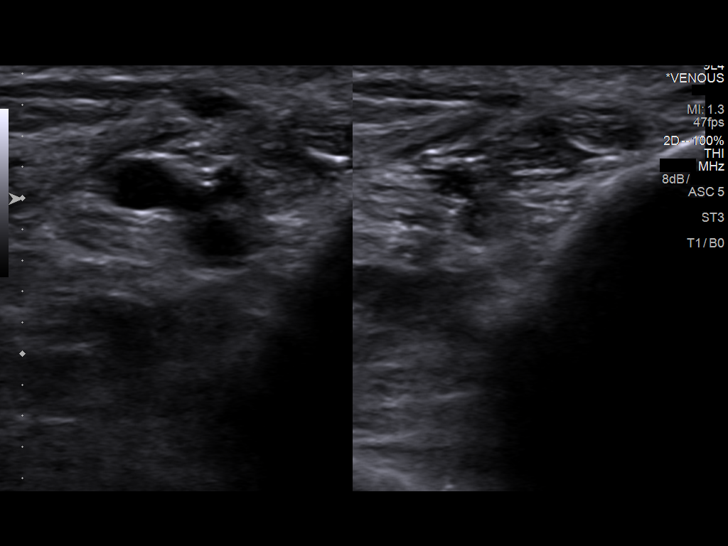
[im 24/27]
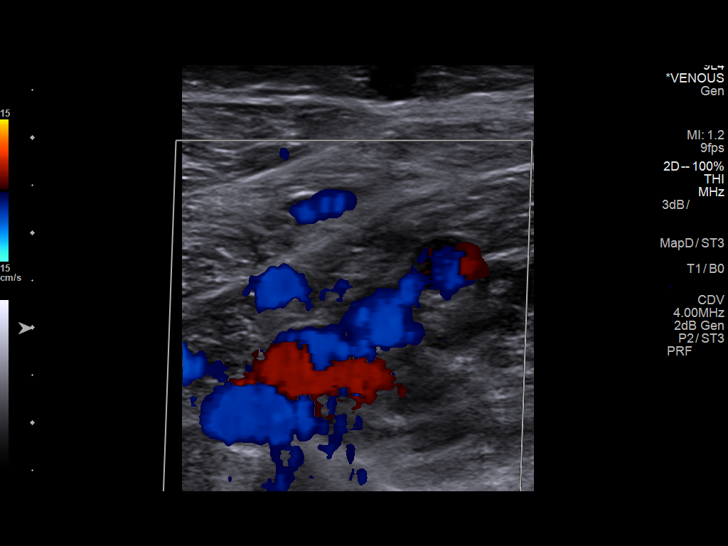
[im 27/27]
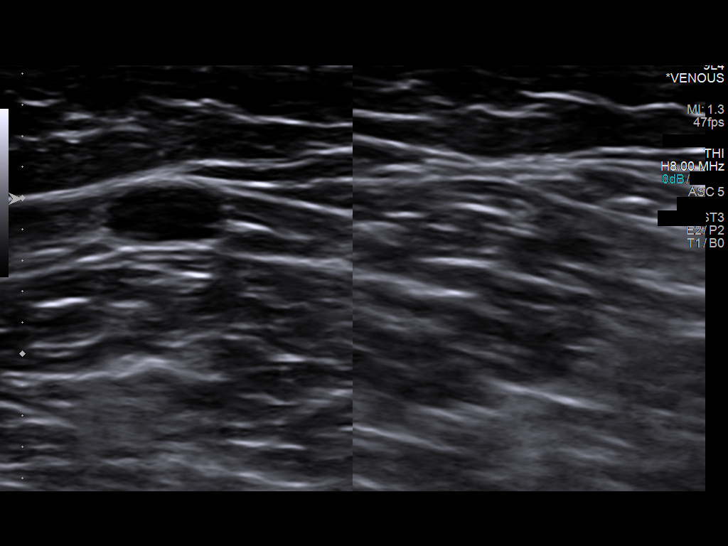

[13 of 24 positions shown; findings below may reference images not displayed]

FINDINGS: Contralateral Common Femoral Vein: Respiratory phasicity is normal
and symmetric with the symptomatic side. No evidence of thrombus.
Normal compressibility.

Common Femoral Vein: No evidence of thrombus. Normal
compressibility, respiratory phasicity and response to augmentation.

Saphenofemoral Junction: No evidence of thrombus. Normal
compressibility and flow on color Doppler imaging.

Profunda Femoral Vein: No evidence of thrombus. Normal
compressibility and flow on color Doppler imaging.

Femoral Vein: No evidence of thrombus. Normal compressibility,
respiratory phasicity and response to augmentation.

Popliteal Vein: No evidence of thrombus. Normal compressibility,
respiratory phasicity and response to augmentation.

Calf Veins: No evidence of thrombus. Normal compressibility and flow
on color Doppler imaging.

Superficial Great Saphenous Vein: No evidence of thrombus. Normal
compressibility and flow on color Doppler imaging.

Venous Reflux:  None.

Other Findings:  None.
IMPRESSION: No evidence of DVT within the left lower extremity.

## 2019-12-09 DIAGNOSIS — Z20828 Contact with and (suspected) exposure to other viral communicable diseases: Secondary | ICD-10-CM | POA: Diagnosis not present

## 2020-02-14 ENCOUNTER — Other Ambulatory Visit: Payer: Self-pay

## 2020-02-15 ENCOUNTER — Ambulatory Visit (INDEPENDENT_AMBULATORY_CARE_PROVIDER_SITE_OTHER): Payer: 59 | Admitting: Family Medicine

## 2020-02-15 ENCOUNTER — Encounter: Payer: Self-pay | Admitting: Family Medicine

## 2020-02-15 VITALS — BP 138/86 | HR 73 | Temp 97.4°F | Wt 176.4 lb

## 2020-02-15 DIAGNOSIS — Z23 Encounter for immunization: Secondary | ICD-10-CM

## 2020-02-15 DIAGNOSIS — Z0000 Encounter for general adult medical examination without abnormal findings: Secondary | ICD-10-CM | POA: Diagnosis not present

## 2020-02-15 LAB — URINALYSIS, ROUTINE W REFLEX MICROSCOPIC
Bilirubin Urine: NEGATIVE
Hgb urine dipstick: NEGATIVE
Ketones, ur: NEGATIVE
Leukocytes,Ua: NEGATIVE
Nitrite: NEGATIVE
RBC / HPF: NONE SEEN (ref 0–?)
Specific Gravity, Urine: 1.01 (ref 1.000–1.030)
Total Protein, Urine: NEGATIVE
Urine Glucose: NEGATIVE
Urobilinogen, UA: 0.2 (ref 0.0–1.0)
WBC, UA: NONE SEEN (ref 0–?)
pH: 6 (ref 5.0–8.0)

## 2020-02-15 LAB — CBC
HCT: 44.3 % (ref 39.0–52.0)
Hemoglobin: 14.7 g/dL (ref 13.0–17.0)
MCHC: 33.1 g/dL (ref 30.0–36.0)
MCV: 89.9 fl (ref 78.0–100.0)
Platelets: 209 10*3/uL (ref 150.0–400.0)
RBC: 4.92 Mil/uL (ref 4.22–5.81)
RDW: 13.2 % (ref 11.5–15.5)
WBC: 6.4 10*3/uL (ref 4.0–10.5)

## 2020-02-15 LAB — PSA: PSA: 0.91 ng/mL (ref 0.10–4.00)

## 2020-02-15 LAB — COMPREHENSIVE METABOLIC PANEL
ALT: 39 U/L (ref 0–53)
AST: 34 U/L (ref 0–37)
Albumin: 4.2 g/dL (ref 3.5–5.2)
Alkaline Phosphatase: 56 U/L (ref 39–117)
BUN: 14 mg/dL (ref 6–23)
CO2: 30 mEq/L (ref 19–32)
Calcium: 8.8 mg/dL (ref 8.4–10.5)
Chloride: 100 mEq/L (ref 96–112)
Creatinine, Ser: 0.81 mg/dL (ref 0.40–1.50)
GFR: 100.61 mL/min (ref >60.00)
Glucose, Bld: 92 mg/dL (ref 70–99)
Potassium: 4.6 mEq/L (ref 3.5–5.1)
Sodium: 137 mEq/L (ref 135–145)
Total Bilirubin: 0.4 mg/dL (ref 0.2–1.2)
Total Protein: 6.9 g/dL (ref 6.0–8.3)

## 2020-02-15 LAB — LIPID PANEL
Cholesterol: 164 mg/dL (ref 0–200)
HDL: 74.1 mg/dL (ref >39.00)
LDL Cholesterol: 79 mg/dL (ref 0–99)
NonHDL: 89.42
Total CHOL/HDL Ratio: 2
Triglycerides: 53 mg/dL (ref 0.0–149.0)
VLDL: 10.6 mg/dL (ref 0.0–40.0)

## 2020-02-15 NOTE — Patient Instructions (Signed)
Health Maintenance, Male Adopting a healthy lifestyle and getting preventive care are important in promoting health and wellness. Ask your health care provider about:  The right schedule for you to have regular tests and exams.  Things you can do on your own to prevent diseases and keep yourself healthy. What should I know about diet, weight, and exercise? Eat a healthy diet   Eat a diet that includes plenty of vegetables, fruits, low-fat dairy products, and lean protein.  Do not eat a lot of foods that are high in solid fats, added sugars, or sodium. Maintain a healthy weight Body mass index (BMI) is a measurement that can be used to identify possible weight problems. It estimates body fat based on height and weight. Your health care provider can help determine your BMI and help you achieve or maintain a healthy weight. Get regular exercise Get regular exercise. This is one of the most important things you can do for your health. Most adults should:  Exercise for at least 150 minutes each week. The exercise should increase your heart rate and make you sweat (moderate-intensity exercise).  Do strengthening exercises at least twice a week. This is in addition to the moderate-intensity exercise.  Spend less time sitting. Even light physical activity can be beneficial. Watch cholesterol and blood lipids Have your blood tested for lipids and cholesterol at 54 years of age, then have this test every 5 years. You may need to have your cholesterol levels checked more often if:  Your lipid or cholesterol levels are high.  You are older than 54 years of age.  You are at high risk for heart disease. What should I know about cancer screening? Many types of cancers can be detected early and may often be prevented. Depending on your health history and family history, you may need to have cancer screening at various ages. This may include screening for:  Colorectal cancer.  Prostate  cancer.  Skin cancer.  Lung cancer. What should I know about heart disease, diabetes, and high blood pressure? Blood pressure and heart disease  High blood pressure causes heart disease and increases the risk of stroke. This is more likely to develop in people who have high blood pressure readings, are of African descent, or are overweight.  Talk with your health care provider about your target blood pressure readings.  Have your blood pressure checked: ? Every 3-5 years if you are 18-39 years of age. ? Every year if you are 40 years old or older.  If you are between the ages of 65 and 75 and are a current or former smoker, ask your health care provider if you should have a one-time screening for abdominal aortic aneurysm (AAA). Diabetes Have regular diabetes screenings. This checks your fasting blood sugar level. Have the screening done:  Once every three years after age 45 if you are at a normal weight and have a low risk for diabetes.  More often and at a younger age if you are overweight or have a high risk for diabetes. What should I know about preventing infection? Hepatitis B If you have a higher risk for hepatitis B, you should be screened for this virus. Talk with your health care provider to find out if you are at risk for hepatitis B infection. Hepatitis C Blood testing is recommended for:  Everyone born from 1945 through 1965.  Anyone with known risk factors for hepatitis C. Sexually transmitted infections (STIs)  You should be screened each year   for STIs, including gonorrhea and chlamydia, if: ? You are sexually active and are younger than 54 years of age. ? You are older than 54 years of age and your health care provider tells you that you are at risk for this type of infection. ? Your sexual activity has changed since you were last screened, and you are at increased risk for chlamydia or gonorrhea. Ask your health care provider if you are at risk.  Ask your  health care provider about whether you are at high risk for HIV. Your health care provider may recommend a prescription medicine to help prevent HIV infection. If you choose to take medicine to prevent HIV, you should first get tested for HIV. You should then be tested every 3 months for as long as you are taking the medicine. Follow these instructions at home: Lifestyle  Do not use any products that contain nicotine or tobacco, such as cigarettes, e-cigarettes, and chewing tobacco. If you need help quitting, ask your health care provider.  Do not use street drugs.  Do not share needles.  Ask your health care provider for help if you need support or information about quitting drugs. Alcohol use  Do not drink alcohol if your health care provider tells you not to drink.  If you drink alcohol: ? Limit how much you have to 0-2 drinks a day. ? Be aware of how much alcohol is in your drink. In the [place] one drink equals one 12 oz bottle of beer (355 mL), one 5 oz glass of [place] (148 mL), or one 1 oz glass of hard liquor (44 mL). General instructions  Schedule regular health, dental, and eye exams.  Stay current with your vaccines.  Tell your health care provider if: ? You often feel depressed. ? You have ever been abused or do not feel safe at home. Summary  Adopting a healthy lifestyle and getting preventive care are important in promoting health and wellness.  Follow your health care provider's instructions about healthy diet, exercising, and getting tested or screened for diseases.  Follow your health care provider's instructions on monitoring your cholesterol and blood pressure. This information is not intended to replace advice given to you by your health care provider. Make sure you discuss any questions you have with your health care provider. Document Revised: 04/08/2018 Document Reviewed: 04/08/2018 Elsevier Patient Education  2020 [employer]  Preventive Care 40-64 Years  Old, Male Preventive care refers to lifestyle choices and visits with your health care provider that can promote health and wellness. This includes:  A yearly physical exam. This is also called an annual well check.  Regular dental and eye exams.  Immunizations.  Screening for certain conditions.  Healthy lifestyle choices, such as eating a healthy diet, getting regular exercise, not using drugs or products that contain nicotine and tobacco, and limiting alcohol use. What can I expect for my preventive care visit? Physical exam Your health care provider will check:  Height and weight. These may be used to calculate body mass index (BMI), which is a measurement that tells if you are at a healthy weight.  Heart rate and blood pressure.  Your skin for abnormal spots. Counseling Your health care provider may ask you questions about:  Alcohol, tobacco, and drug use.  Emotional well-being.  Home and relationship well-being.  Sexual activity.  Eating habits.  Work and work environment. What immunizations do I need?  Influenza (flu) vaccine  This is recommended every year. Tetanus, diphtheria,   and pertussis (Tdap) vaccine  You may need a Td booster every 10 years. Varicella (chickenpox) vaccine  You may need this vaccine if you have not already been vaccinated. Zoster (shingles) vaccine  You may need this after age 63. Measles, mumps, and rubella (MMR) vaccine  You may need at least one dose of MMR if you were born in 1957 or later. You may also need a second dose. Pneumococcal conjugate (PCV13) vaccine  You may need this if you have certain conditions and were not previously vaccinated. Pneumococcal polysaccharide (PPSV23) vaccine  You may need one or two doses if you smoke cigarettes or if you have certain conditions. Meningococcal conjugate (MenACWY) vaccine  You may need this if you have certain conditions. Hepatitis A vaccine  You may need this if you have  certain conditions or if you travel or work in places where you may be exposed to hepatitis A. Hepatitis B vaccine  You may need this if you have certain conditions or if you travel or work in places where you may be exposed to hepatitis B. Haemophilus influenzae type b (Hib) vaccine  You may need this if you have certain risk factors. Human papillomavirus (HPV) vaccine  If recommended by your health care provider, you may need three doses over 6 months. You may receive vaccines as individual doses or as more than one vaccine together in one shot (combination vaccines). Talk with your health care provider about the risks and benefits of combination vaccines. What tests do I need? Blood tests  Lipid and cholesterol levels. These may be checked every 5 years, or more frequently if you are over 68 years old.  Hepatitis C test.  Hepatitis B test. Screening  Lung cancer screening. You may have this screening every year starting at age 78 if you have a 30-pack-year history of smoking and currently smoke or have quit within the past 15 years.  Prostate cancer screening. Recommendations will vary depending on your family history and other risks.  Colorectal cancer screening. All adults should have this screening starting at age 38 and continuing until age 22. Your health care provider may recommend screening at age 73 if you are at increased risk. You will have tests every 1-10 years, depending on your results and the type of screening test.  Diabetes screening. This is done by checking your blood sugar (glucose) after you have not eaten for a while (fasting). You may have this done every 1-3 years.  Sexually transmitted disease (STD) testing. Follow these instructions at home: Eating and drinking  Eat a diet that includes fresh fruits and vegetables, whole grains, lean protein, and low-fat dairy products.  Take vitamin and mineral supplements as recommended by your health care  provider.  Do not drink alcohol if your health care provider tells you not to drink.  If you drink alcohol: ? Limit how much you have to 0-2 drinks a day. ? Be aware of how much alcohol is in your drink. In the [place] one drink equals one 12 oz bottle of beer (355 mL), one 5 oz glass of [place] (148 mL), or one 1 oz glass of hard liquor (44 mL). Lifestyle  Take daily care of your teeth and gums.  Stay active. Exercise for at least 30 minutes on 5 or more days each week.  Do not use any products that contain nicotine or tobacco, such as cigarettes, e-cigarettes, and chewing tobacco. If you need help quitting, ask your health care provider.  If  you are sexually active, practice safe sex. Use a condom or other form of protection to prevent STIs (sexually transmitted infections).  Talk with your health care provider about taking a low-dose aspirin every day starting at age 50. What's next?  Go to your health care provider once a year for a well check visit.  Ask your health care provider how often you should have your eyes and teeth checked.  Stay up to date on all vaccines. This information is not intended to replace advice given to you by your health care provider. Make sure you discuss any questions you have with your health care provider. Document Revised: 04/09/2018 Document Reviewed: 04/09/2018 Elsevier Patient Education  2020 [employer].  

## 2020-02-15 NOTE — Progress Notes (Signed)
Established Patient Office Visit  Subjective:  Patient ID: Dustin Mendez, male    DOB: 05-31-1965  Age: 54 y.o. MRN: [id number]  CC:  Chief Complaint  Patient presents with  . Annual Exam    CPE, no concerns. Patient fasting for labs     HPI Dustin Mendez presents for complete physical exam.  He is fasting today.  Doing well.  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  He is quite active doing this.  Has regular dental care.  Maintains a healthy diet.  Minimizes alcohol.  No smoking or illicit drug use.  Urine flow has been excellent.  No problems moving his bowels.  Past Medical History:  Diagnosis Date  . Allergy   . Arthritis   . CTS (carpal tunnel syndrome) 10/21/2014  . Diverticulosis 12/31/2016  . Gastro-esophageal reflux 11/06/2014   pt denies  . Hx of colonic polyp 12/31/2016  . Muscle cramps 12/31/2016  . Neck pain 10/21/2014    Past Surgical History:  Procedure Laterality Date  . APPENDECTOMY     done as a teenager  . HERNIA REPAIR     umbilical  . TYMPANOSTOMY TUBE PLACEMENT Bilateral    childhood    Family History  Problem Relation Age of Onset  . Arthritis Mother   . Hypertension Mother   . Cholelithiasis Mother   . Arthritis Father   . Hypertension Father   . Heart disease Father        arrythmia s/p ablation  . Hyperlipidemia Father   . Diabetes Father   . Thyroid disease Brother   . COPD Maternal Grandfather        silicosis? work exposure and a smoker  . Heart disease Paternal Grandmother        arrythmia dies while placing pacer  . Multiple sclerosis Paternal Grandfather   . Colon cancer Neg Hx     Social History   Socioeconomic History  . Marital status: Married    Spouse name: Not on file  . Number of children: Not on file  . Years of education: 48  . Highest education level: Not on file  Occupational History  . Occupation: Cabin crew  Tobacco Use  . Smoking status: Former Smoker    Types: Cigarettes    Quit date: 02/19/2014     Years since quitting: 5.9  . Smokeless tobacco: Former Systems developer    Types: [place] date: 02/01/2014  Vaping Use  . Vaping Use: Every day  Substance and Sexual Activity  . Alcohol use: Yes    Alcohol/week: 7.0 standard drinks    Types: 7 Cans of beer per week    Comment: beer   . Drug use: No  . 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 Not on file  Social History Narrative  . Not on file   Social Determinants of Health   Financial Resource Strain:   . Difficulty of Paying Living Expenses: Not on file  Food Insecurity:   . Worried About Charity fundraiser in the Last Year: Not on file  . Ran Out of Food in the Last Year: Not on file  Transportation Needs:   . Lack of Transportation (Medical): Not on file  . Lack of Transportation (Non-Medical): Not on file  Physical Activity:   . Days of Exercise per Week: Not on file  .  Minutes of Exercise per Session: Not on file  Stress:   . Feeling of Stress : Not on file  Social Connections:   . Frequency of Communication with Friends and Family: Not on file  . Frequency of Social Gatherings with Friends and Family: Not on file  . Attends Religious Services: Not on file  . Active Member of Clubs or Organizations: Not on file  . Attends Archivist Meetings: Not on file  . Marital Status: Not on file  Intimate Partner Violence:   . Fear of Current or Ex-Partner: Not on file  . Emotionally Abused: Not on file  . Physically Abused: Not on file  . Sexually Abused: Not on file    Outpatient Medications Prior to Visit  Medication Sig Dispense Refill  . cephALEXin (KEFLEX) 500 MG capsule Take 1 capsule (500 mg total) by mouth 4 (four) times daily. (Patient not taking: Reported on 02/15/2020) 20 capsule 0  . levocetirizine (XYZAL) 5 MG tablet Take 5 mg by mouth daily as needed for allergies. (Patient not taking: Reported on 02/15/2020)       No facility-administered medications prior to visit.    No Known Allergies  ROS Review of Systems  Constitutional: Negative.   HENT: Negative.   Eyes: Negative for photophobia and visual disturbance.  Respiratory: Negative.   Cardiovascular: Negative.   Gastrointestinal: Negative.   Endocrine: Negative for polyphagia and polyuria.  Genitourinary: Negative.   Musculoskeletal: Negative for gait problem and joint swelling.  Skin: Negative for pallor and rash.  Allergic/Immunologic: Negative for immunocompromised state.  Hematological: Does not bruise/bleed easily.  Psychiatric/Behavioral: Negative.    Depression screen [facility] 2/9 02/15/2020 02/15/2020 12/31/2016  Decreased Interest 0 0 0  Down, Depressed, Hopeless 0 0 0  PHQ - 2 Score 0 0 0  Altered sleeping 0 - 0  Tired, decreased energy 0 - 0  Change in appetite 0 - 0  Feeling bad or failure about yourself  0 - 0  Trouble concentrating 0 - 0  Moving slowly or fidgety/restless 0 - 0  Suicidal thoughts 0 - 0  PHQ-9 Score 0 - 0  Difficult doing work/chores Not difficult at all - -      Objective:    Physical Exam Vitals and nursing note reviewed.  Constitutional:      General: He is not in acute distress.    Appearance: Normal appearance. He is normal weight. He is not ill-appearing, toxic-appearing or diaphoretic.  HENT:     Head: Normocephalic and atraumatic.     Right Ear: Tympanic membrane, ear canal and external ear normal.     Left Ear: Tympanic membrane, ear canal and external ear normal.     Mouth/Throat:     Mouth: Mucous membranes are moist.     Pharynx: Oropharynx is clear. No oropharyngeal exudate or posterior oropharyngeal erythema.  Eyes:     General: No scleral icterus.       Right eye: No discharge.        Left eye: No discharge.     Extraocular Movements: Extraocular movements intact.     Pupils: Pupils are equal, round, and reactive to light.  Cardiovascular:     Rate and Rhythm: Normal rate and  regular rhythm.  Pulmonary:     Effort: Pulmonary effort is normal.     Breath sounds: Normal breath sounds.  Abdominal:     General: Abdomen is flat. Bowel sounds are normal. There is no distension.  Palpations: There is no mass.     Tenderness: There is no abdominal tenderness. There is no guarding or rebound.     Hernia: No hernia is present. There is no hernia in the left inguinal area or right inguinal area.  Genitourinary:    Penis: Circumcised. No hypospadias, erythema, tenderness, discharge, swelling or lesions.      Testes:        Right: Mass, tenderness or swelling not present. Right testis is descended.        Left: Tenderness or swelling not present. Left testis is descended.     Epididymis:     Right: Not inflamed or enlarged.     Left: Not inflamed or enlarged.     Prostate: Not enlarged, not tender and no nodules present.     Rectum: Guaiac result negative. No mass, tenderness, anal fissure, external hemorrhoid or internal hemorrhoid. Normal anal tone.  Musculoskeletal:     Cervical back: Normal range of motion. No rigidity or tenderness.     Right lower leg: No edema.     Left lower leg: No edema.  Lymphadenopathy:     Cervical: No cervical adenopathy.     Lower Body: No right inguinal adenopathy. No left inguinal adenopathy.  Skin:    General: Skin is warm and dry.  Neurological:     Mental Status: He is alert and oriented to person, place, and time.  Psychiatric:        Mood and Affect: Mood normal.        Behavior: Behavior normal.     BP 138/86   Pulse 73   Temp (!) 97.4 F (36.3 C) (Tympanic)   Wt 176 lb 6.4 oz (80 kg)   SpO2 98%   BMI 24.60 kg/m  Wt Readings from Last 3 Encounters:  02/15/20 176 lb 6.4 oz (80 kg)  05/31/19 179 lb (81.2 kg)  01/08/18 175 lb 6.4 oz (79.6 kg)     Health Maintenance Due  Topic Date Due  . Hepatitis C Screening  Never done  . INFLUENZA VACCINE  11/28/2019    There are no preventive care reminders to display  for this patient.  Lab Results  Component Value Date   TSH 2.94 12/31/2016   Lab Results  Component Value Date   WBC 5.0 01/08/2018   HGB 14.4 01/08/2018   HCT 43.2 01/08/2018   MCV 88.2 01/08/2018   PLT 190.0 01/08/2018   Lab Results  Component Value Date   NA 140 01/08/2018   K 4.7 01/08/2018   CO2 31 01/08/2018   GLUCOSE 94 01/08/2018   BUN 14 01/08/2018   CREATININE 0.85 01/08/2018   BILITOT 0.4 01/08/2018   ALKPHOS 53 01/08/2018   AST 29 01/08/2018   ALT 40 01/08/2018   PROT 7.1 01/08/2018   ALBUMIN 4.3 01/08/2018   CALCIUM 9.2 01/08/2018   GFR 100.56 01/08/2018   Lab Results  Component Value Date   CHOL 147 01/08/2018   Lab Results  Component Value Date   HDL 58.70 01/08/2018   Lab Results  Component Value Date   LDLCALC 68 01/08/2018   Lab Results  Component Value Date   TRIG 100.0 01/08/2018   Lab Results  Component Value Date   CHOLHDL 3 01/08/2018   No results found for: HGBA1C    Assessment & Plan:   Problem List Items Addressed This Visit      Other   Healthcare maintenance   Relevant Orders   CBC  Comprehensive metabolic panel   Lipid panel   PSA   Urinalysis, Routine w reflex microscopic   Need for influenza vaccination - Primary   Relevant Orders   Flu Vaccine QUAD 6+ mos PF IM (Fluarix Quad PF)      No orders of the defined types were placed in this encounter.   Follow-up: Return in about 1 year (around 02/14/2021).  Encouraged by patient to maintain his healthy lifestyle.  He was given information about health maintenance and disease prevention.  Follow-up in 1 year or sooner if needed.  Libby Maw, MD

## 2020-02-21 ENCOUNTER — Telehealth: Payer: Self-pay | Admitting: Family Medicine

## 2020-02-21 NOTE — Telephone Encounter (Signed)
Patient is returning the call, please advise. CB is [place]

## 2020-02-22 NOTE — Telephone Encounter (Signed)
Returned patients call no answer LMTCB 

## 2021-02-16 ENCOUNTER — Encounter: Payer: Self-pay | Admitting: Family Medicine

## 2021-02-16 ENCOUNTER — Ambulatory Visit: Payer: 59

## 2021-02-16 ENCOUNTER — Ambulatory Visit (INDEPENDENT_AMBULATORY_CARE_PROVIDER_SITE_OTHER): Payer: 59 | Admitting: Family Medicine

## 2021-02-16 ENCOUNTER — Other Ambulatory Visit: Payer: Self-pay

## 2021-02-16 VITALS — BP 136/78 | HR 81 | Temp 97.3°F | Ht 71.0 in | Wt 180.4 lb

## 2021-02-16 DIAGNOSIS — G8929 Other chronic pain: Secondary | ICD-10-CM

## 2021-02-16 DIAGNOSIS — M25512 Pain in left shoulder: Secondary | ICD-10-CM

## 2021-02-16 DIAGNOSIS — D229 Melanocytic nevi, unspecified: Secondary | ICD-10-CM

## 2021-02-16 DIAGNOSIS — J301 Allergic rhinitis due to pollen: Secondary | ICD-10-CM | POA: Diagnosis not present

## 2021-02-16 DIAGNOSIS — Z23 Encounter for immunization: Secondary | ICD-10-CM

## 2021-02-16 DIAGNOSIS — Z0000 Encounter for general adult medical examination without abnormal findings: Secondary | ICD-10-CM

## 2021-02-16 LAB — CBC
HCT: 45.8 % (ref 39.0–52.0)
Hemoglobin: 14.9 g/dL (ref 13.0–17.0)
MCHC: 32.5 g/dL (ref 30.0–36.0)
MCV: 89.8 fl (ref 78.0–100.0)
Platelets: 259 10*3/uL (ref 150.0–400.0)
RBC: 5.09 Mil/uL (ref 4.22–5.81)
RDW: 13.4 % (ref 11.5–15.5)
WBC: 5.8 10*3/uL (ref 4.0–10.5)

## 2021-02-16 LAB — LIPID PANEL
Cholesterol: 195 mg/dL (ref 0–200)
HDL: 77 mg/dL (ref >39.00)
LDL Cholesterol: 81 mg/dL (ref 0–99)
NonHDL: 118.04
Total CHOL/HDL Ratio: 3
Triglycerides: 186 mg/dL — ABNORMAL HIGH (ref 0.0–149.0)
VLDL: 37.2 mg/dL (ref 0.0–40.0)

## 2021-02-16 LAB — COMPREHENSIVE METABOLIC PANEL
ALT: 41 U/L (ref 0–53)
AST: 34 U/L (ref 0–37)
Albumin: 4.9 g/dL (ref 3.5–5.2)
Alkaline Phosphatase: 64 U/L (ref 39–117)
BUN: 13 mg/dL (ref 6–23)
CO2: 32 mEq/L (ref 19–32)
Calcium: 9.7 mg/dL (ref 8.4–10.5)
Chloride: 96 mEq/L (ref 96–112)
Creatinine, Ser: 0.81 mg/dL (ref 0.40–1.50)
GFR: 99.46 mL/min (ref >60.00)
Glucose, Bld: 87 mg/dL (ref 70–99)
Potassium: 4.2 mEq/L (ref 3.5–5.1)
Sodium: 136 mEq/L (ref 135–145)
Total Bilirubin: 0.6 mg/dL (ref 0.2–1.2)
Total Protein: 7.9 g/dL (ref 6.0–8.3)

## 2021-02-16 LAB — URINALYSIS, ROUTINE W REFLEX MICROSCOPIC
Bilirubin Urine: NEGATIVE
Hgb urine dipstick: NEGATIVE
Ketones, ur: NEGATIVE
Leukocytes,Ua: NEGATIVE
Nitrite: NEGATIVE
RBC / HPF: NONE SEEN (ref 0–?)
Specific Gravity, Urine: 1.01 (ref 1.000–1.030)
Total Protein, Urine: NEGATIVE
Urine Glucose: NEGATIVE
Urobilinogen, UA: 0.2 (ref 0.0–1.0)
pH: 7 (ref 5.0–8.0)

## 2021-02-16 MED ORDER — FLUNISOLIDE 25 MCG/ACT (0.025%) NA SOLN: 2.0000 | Freq: Two times a day (BID) | NASAL | 4 refills | Status: DC

## 2021-02-16 NOTE — Progress Notes (Signed)
Established Patient Office Visit  Subjective:  Patient ID: Dustin Mendez, male    DOB: [birth year]  Age: 55 y.o. MRN: [id number]  CC:  Chief Complaint  Patient presents with   Annual Exam    CPE/lab. Fasting today.  Wants flu shot today.     HPI Dustin Mendez presents for his yearly physical has been doing relatively well.  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  Tells of ongoing left shoulder pain.  He is right-hand dominant.  He does over shoulder work and lifting.  No particular injury but was actively involved in martial arts.  Has chronic ongoing issues with seasonal allergies.  They have been difficult for him to control.  Claritin has been ineffective in [place] has caused drowsiness.   Past Medical History:  Diagnosis Date   Allergy    Arthritis    CTS (carpal tunnel syndrome) 10/21/2014   Diverticulosis 12/31/2016   Gastro-esophageal reflux 11/06/2014   pt denies   Hx of colonic polyp 12/31/2016   Muscle cramps 12/31/2016   Neck pain 10/21/2014    Past Surgical History:  Procedure Laterality Date   APPENDECTOMY     done as a teenager   HERNIA REPAIR     umbilical   TYMPANOSTOMY TUBE PLACEMENT Bilateral    childhood    Family History  Problem Relation Age of Onset   Arthritis Mother    Hypertension Mother    Cholelithiasis Mother    Arthritis Father    Hypertension Father    Heart disease Father        arrythmia s/p ablation   Hyperlipidemia Father    Diabetes Father    Thyroid disease Brother    COPD Maternal Grandfather        silicosis? work exposure and a smoker   Heart disease Paternal Grandmother        arrythmia dies while placing pacer   Multiple sclerosis Paternal Grandfather    Colon cancer Neg Hx     Social History   Socioeconomic History   Marital status: Married    Spouse name: Not on file   Number of children: Not on file   Years of education: 14   Highest education level: Not on file  Occupational History    Occupation: Cabin crew  Tobacco Use   Smoking status: Former    Types: Cigarettes    Quit date: 02/19/2014    Years since quitting: 6.9   Smokeless tobacco: Former    Types: Chew    Quit date: 02/01/2014  Vaping Use   Vaping Use: Every day  Substance and Sexual Activity   Alcohol use: Yes    Alcohol/week: 7.0 standard drinks    Types: 7 Cans of beer per week    Comment: beer    Drug use: No   Sexual activity: Yes    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     cephALEXin (KEFLEX) 500 MG capsule Take 1 capsule (500 mg total) by mouth 4 (four) times daily. (Patient not taking: Reported on 02/15/2020) 20 capsule 0   levocetirizine (XYZAL) 5 MG tablet Take 5 mg by mouth daily as needed for allergies. (Patient not taking: Reported on 02/15/2020)     No facility-administered medications prior to visit.    No Known Allergies  ROS Review of Systems  Constitutional: Negative.   HENT:  Positive for congestion, postnasal drip and sneezing.   Eyes:  Negative for photophobia and visual disturbance.  Respiratory: Negative.    Cardiovascular: Negative.   Gastrointestinal: Negative.   Genitourinary:  Negative for difficulty urinating, frequency and urgency.  Musculoskeletal:  Positive for arthralgias.  Neurological:  Negative for speech difficulty and weakness.  Psychiatric/Behavioral: Negative.    Depression screen [facility] 2/9 02/16/2021 02/15/2020  02/15/2020  Decreased Interest 0 0 0  Down, Depressed, Hopeless 0 0 0  PHQ - 2 Score 0 0 0  Altered sleeping - 0 -  Tired, decreased energy - 0 -  Change in appetite - 0 -  Feeling bad or failure about yourself  - 0 -  Trouble concentrating - 0 -  Moving slowly or fidgety/restless - 0 -  Suicidal thoughts - 0 -  PHQ-9 Score - 0 -  Difficult doing work/chores - Not difficult at all -       Objective:    Physical Exam Vitals and nursing note reviewed.  Constitutional:      General: He is not in acute distress.    Appearance: Normal appearance. He is normal weight. He is not ill-appearing, toxic-appearing or diaphoretic.  HENT:     Head: Normocephalic and atraumatic.     Right Ear: Tympanic membrane, ear canal and external ear normal.     Left Ear: Tympanic membrane, ear canal and external ear normal.     Mouth/Throat:     Mouth: Mucous membranes are moist.     Pharynx: Oropharynx is clear. No oropharyngeal exudate or posterior oropharyngeal erythema.  Eyes:     General: No scleral icterus.       Right eye: No discharge.        Left eye: No discharge.     Extraocular Movements: Extraocular movements intact.     Conjunctiva/sclera: Conjunctivae normal.     Pupils: Pupils are equal, round, and reactive to light.  Neck:     Vascular: No carotid bruit.  Cardiovascular:     Rate and Rhythm: Normal rate and regular rhythm.  Pulmonary:     Effort: Pulmonary effort is normal.     Breath sounds: Normal breath sounds.  Abdominal:     General: Abdomen is flat. Bowel sounds are normal. There is no distension.  Musculoskeletal:     Left shoulder: Bony tenderness present. Normal range of motion.       Arms:     Cervical back: No rigidity or tenderness.  Lymphadenopathy:     Cervical: No cervical adenopathy.  Skin:    General: Skin is warm and dry.  Neurological:     General: No focal deficit present.     Mental Status: He is alert and oriented to person, place, and time.   Psychiatric:        Mood and Affect: Mood normal.        Behavior: Behavior normal.    BP 136/78   Pulse 81   Temp (!) 97.3 F (36.3 C)  Ht 5\' 11"  (1.803 m)   Wt 180 lb 6.4 oz (81.8 kg)   SpO2 99%   BMI 25.16 kg/m  Wt Readings from Last 3 Encounters:  02/16/21 180 lb 6.4 oz (81.8 kg)  02/15/20 176 lb 6.4 oz (80 kg)  05/31/19 179 lb (81.2 kg)     Health Maintenance Due  Topic Date Due   Hepatitis C Screening  Never done   Zoster Vaccines- Shingrix (1 of 2) Never done   COVID-19 Vaccine (3 - Booster for Moderna series) 10/13/2019   INFLUENZA VACCINE  11/27/2020    There are no preventive care reminders to display for this patient.  Lab Results  Component Value Date   TSH 2.94 12/31/2016   Lab Results  Component Value Date   WBC 6.4 02/15/2020   HGB 14.7 02/15/2020   HCT 44.3 02/15/2020   MCV 89.9 02/15/2020   PLT 209.0 02/15/2020   Lab Results  Component Value Date   NA 137 02/15/2020   K 4.6 02/15/2020   CO2 30 02/15/2020   GLUCOSE 92 02/15/2020   BUN 14 02/15/2020   CREATININE 0.81 02/15/2020   BILITOT 0.4 02/15/2020   ALKPHOS 56 02/15/2020   AST 34 02/15/2020   ALT 39 02/15/2020   PROT 6.9 02/15/2020   ALBUMIN 4.2 02/15/2020   CALCIUM 8.8 02/15/2020   GFR 100.61 02/15/2020   Lab Results  Component Value Date   CHOL 164 02/15/2020   Lab Results  Component Value Date   HDL 74.10 02/15/2020   Lab Results  Component Value Date   LDLCALC 79 02/15/2020   Lab Results  Component Value Date   TRIG 53.0 02/15/2020   Lab Results  Component Value Date   CHOLHDL 2 02/15/2020   No results found for: HGBA1C    Assessment & Plan:   Problem List Items Addressed This Visit       Respiratory   Seasonal allergic rhinitis due to pollen   Relevant Medications   flunisolide (NASALIDE) 25 MCG/ACT (0.025%) SOLN   Other Relevant Orders   Ambulatory referral to Allergy     Musculoskeletal and Integument   Atypical nevus   Relevant Orders    Ambulatory referral to Dermatology     Other   Healthcare maintenance   Relevant Orders   CBC   Comprehensive metabolic panel   Lipid panel   PSA   Urinalysis, Routine w reflex microscopic   Chronic left shoulder pain - Primary   Relevant Orders   Ambulatory referral to Sports Medicine   DG Shoulder Left    Meds ordered this encounter  Medications   flunisolide (NASALIDE) 25 MCG/ACT (0.025%) SOLN    Sig: Place 2 sprays into the nose 2 (two) times daily.    Dispense:  25 mL    Refill:  4    Follow-up: No follow-ups on file.    Libby Maw, MD

## 2021-02-19 LAB — PSA: PSA: 1.02 ng/mL (ref 0.10–4.00)

## 2021-02-21 ENCOUNTER — Other Ambulatory Visit: Payer: Self-pay

## 2021-02-21 ENCOUNTER — Ambulatory Visit (INDEPENDENT_AMBULATORY_CARE_PROVIDER_SITE_OTHER): Payer: 59

## 2021-02-21 ENCOUNTER — Other Ambulatory Visit: Payer: 59

## 2021-02-21 DIAGNOSIS — M19012 Primary osteoarthritis, left shoulder: Secondary | ICD-10-CM | POA: Diagnosis not present

## 2021-02-21 DIAGNOSIS — M25512 Pain in left shoulder: Secondary | ICD-10-CM | POA: Diagnosis not present

## 2021-02-21 DIAGNOSIS — G8929 Other chronic pain: Secondary | ICD-10-CM

## 2021-03-05 ENCOUNTER — Ambulatory Visit: Payer: 59 | Admitting: Family Medicine

## 2021-03-05 ENCOUNTER — Ambulatory Visit: Payer: Self-pay

## 2021-03-05 VITALS — BP 138/80 | Ht 71.0 in | Wt 180.0 lb

## 2021-03-05 DIAGNOSIS — M778 Other enthesopathies, not elsewhere classified: Secondary | ICD-10-CM

## 2021-03-05 NOTE — Progress Notes (Signed)
Dustin Mendez - 55 y.o. male MRN [id number]  Date of birth: 04-24-1966  SUBJECTIVE:  Including CC & ROS.  No chief complaint on file.   Dustin Mendez is a 55 y.o. male that is presenting with acute on chronic left shoulder pain.  The pain is occurring over the joint itself.  No specific injury or inciting event.  He works as a Dealer.  Has not tried any medications.  Symptoms seem to be staying the same.  Independent review of the left shoulder x-ray from 10/26 shows no acute changes.   Review of Systems See HPI   HISTORY: Past Medical, Surgical, Social, and Family History Reviewed & Updated per EMR.   Pertinent Historical Findings include:  Past Medical History:  Diagnosis Date   Allergy    Arthritis    CTS (carpal tunnel syndrome) 10/21/2014   Diverticulosis 12/31/2016   Gastro-esophageal reflux 11/06/2014   pt denies   Hx of colonic polyp 12/31/2016   Muscle cramps 12/31/2016   Neck pain 10/21/2014    Past Surgical History:  Procedure Laterality Date   APPENDECTOMY     done as a teenager   HERNIA REPAIR     umbilical   TYMPANOSTOMY TUBE PLACEMENT Bilateral    childhood    Family History  Problem Relation Age of Onset   Arthritis Mother    Hypertension Mother    Cholelithiasis Mother    Arthritis Father    Hypertension Father    Heart disease Father        arrythmia s/p ablation   Hyperlipidemia Father    Diabetes Father    Thyroid disease Brother    COPD Maternal Grandfather        silicosis? work exposure and a smoker   Heart disease Paternal Grandmother        arrythmia dies while placing pacer   Multiple sclerosis Paternal Grandfather    Colon cancer Neg Hx     Social History   Socioeconomic History   Marital status: Married    Spouse name: Not on file   Number of children: Not on file   Years of education: 14   Highest education level: Not on file  Occupational History   Occupation: Cabin crew  Tobacco Use   Smoking status: Former    Types:  Cigarettes    Quit date: 02/19/2014    Years since quitting: 7.0   Smokeless tobacco: Former    Types: Chew    Quit date: 02/01/2014  Vaping Use   Vaping Use: Every day  Substance and Sexual Activity   Alcohol use: Yes    Alcohol/week: 7.0 standard drinks    Types: 7 Cans of beer per week    Comment: beer    Drug use: No   Sexual activity: Yes    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, no dietary restrictions  Other Topics Concern   Not on file  Social History Narrative   Not on file   Social Determinants of Health   Financial Resource Strain: Not on file  Food Insecurity: Not on file  Transportation Needs: Not on file  Physical Activity: Not on file  Stress: Not on file  Social Connections: Not on file  Intimate Partner Violence: Not on file     PHYSICAL EXAM:  VS: BP 138/80   Ht 5\' 11"  (4.270 m)   Wt 180 lb (81.6 kg)   BMI 25.10 kg/m  Physical Exam Gen: NAD, alert,  cooperative with exam, well-appearing   Limited ultrasound: Left shoulder:  Normal-appearing biceps tendon. Normal-appearing subscapularis and static and dynamic testing. Mild degenerative changes of the supraspinatus but no impingement on dynamic testing. Mild degenerative change of the posterior labrum.  Summary: No specific structural changes  Ultrasound and interpretation by Clearance Coots, MD     ASSESSMENT & PLAN:   Capsulitis of left shoulder Symptoms seem more consistent with a capsulitis with limited range of motion in external rotation on the left when compared to the right.  No structural change appreciated ultrasound today and no degenerative changes of the joint. -Counseled on home exercise therapy and supportive care. -Counseled on Voltaren. -Could consider injection physical therapy.

## 2021-03-05 NOTE — Assessment & Plan Note (Signed)
Symptoms seem more consistent with a capsulitis with limited range of motion in external rotation on the left when compared to the right.  No structural change appreciated ultrasound today and no degenerative changes of the joint. -Counseled on home exercise therapy and supportive care. -Counseled on Voltaren. -Could consider injection physical therapy.

## 2021-03-05 NOTE — Patient Instructions (Signed)
Nice to meet you Please try heat before exercise and ice after  Please try the exercises  You can try voltaren for pain   Please send me a message in [place] with any questions or updates.  Please see me back in 4 weeks.   --Dr. Raeford Razor

## 2021-04-02 ENCOUNTER — Ambulatory Visit (INDEPENDENT_AMBULATORY_CARE_PROVIDER_SITE_OTHER): Payer: 59 | Admitting: Family Medicine

## 2021-04-02 DIAGNOSIS — M778 Other enthesopathies, not elsewhere classified: Secondary | ICD-10-CM

## 2021-04-02 NOTE — Progress Notes (Signed)
Dustin Mendez - 55 y.o. male MRN [id number]  Date of birth: Nov 22, 1965  SUBJECTIVE:  Including CC & ROS.  No chief complaint on file.   Dustin Mendez is a 55 y.o. male that is following up for his left shoulder pain.  He reports significant improvement in his pain and function.  He continues to do home exercises.  The pain is essentially gone.    Review of Systems See HPI   HISTORY: Past Medical, Surgical, Social, and Family History Reviewed & Updated per EMR.   Pertinent Historical Findings include:  Past Medical History:  Diagnosis Date   Allergy    Arthritis    CTS (carpal tunnel syndrome) 10/21/2014   Diverticulosis 12/31/2016   Gastro-esophageal reflux 11/06/2014   pt denies   Hx of colonic polyp 12/31/2016   Muscle cramps 12/31/2016   Neck pain 10/21/2014    Past Surgical History:  Procedure Laterality Date   APPENDECTOMY     done as a teenager   HERNIA REPAIR     umbilical   TYMPANOSTOMY TUBE PLACEMENT Bilateral    childhood    Family History  Problem Relation Age of Onset   Arthritis Mother    Hypertension Mother    Cholelithiasis Mother    Arthritis Father    Hypertension Father    Heart disease Father        arrythmia s/p ablation   Hyperlipidemia Father    Diabetes Father    Thyroid disease Brother    COPD Maternal Grandfather        silicosis? work exposure and a smoker   Heart disease Paternal Grandmother        arrythmia dies while placing pacer   Multiple sclerosis Paternal Grandfather    Colon cancer Neg Hx     Social History   Socioeconomic History   Marital status: Married    Spouse name: Not on file   Number of children: Not on file   Years of education: 14   Highest education level: Not on file  Occupational History   Occupation: Cabin crew  Tobacco Use   Smoking status: Former    Types: Cigarettes    Quit date: 02/19/2014    Years since quitting: 7.1   Smokeless tobacco: Former    Types: Chew    Quit date: 02/01/2014  Vaping Use    Vaping Use: Every day  Substance and Sexual Activity   Alcohol use: Yes    Alcohol/week: 7.0 standard drinks    Types: 7 Cans of beer per week    Comment: beer    Drug use: No   Sexual activity: Yes    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, no dietary restrictions  Other Topics Concern   Not on file  Social History Narrative   Not on file   Social Determinants of Health   Financial Resource Strain: Not on file  Food Insecurity: Not on file  Transportation Needs: Not on file  Physical Activity: Not on file  Stress: Not on file  Social Connections: Not on file  Intimate Partner Violence: Not on file     PHYSICAL EXAM:  VS: BP (!) 142/82   Ht 5\' 11"  (1.803 m)   Wt 180 lb (81.6 kg)   BMI 25.10 kg/m  Physical Exam Gen: NAD, alert, cooperative with exam, well-appearing    ASSESSMENT & PLAN:   Capsulitis of left shoulder Has been doing well with home exercises and reports improvement  in his pain and function. -Counseled on home exercise therapy and supportive care. -Could consider physical therapy.

## 2021-04-02 NOTE — Assessment & Plan Note (Signed)
Has been doing well with home exercises and reports improvement in his pain and function. -Counseled on home exercise therapy and supportive care. -Could consider physical therapy.

## 2021-11-25 IMAGING — DX DG SHOULDER 2+V*L*
3 series · 3 of 3 positions shown · non-contrast
Comparison: None.

CLINICAL DATA: ttp of ac joint.  Chronic left shoulder pain.

EXAM:
LEFT SHOULDER - 2+ VIEW

[shoulder (grashey) ap]
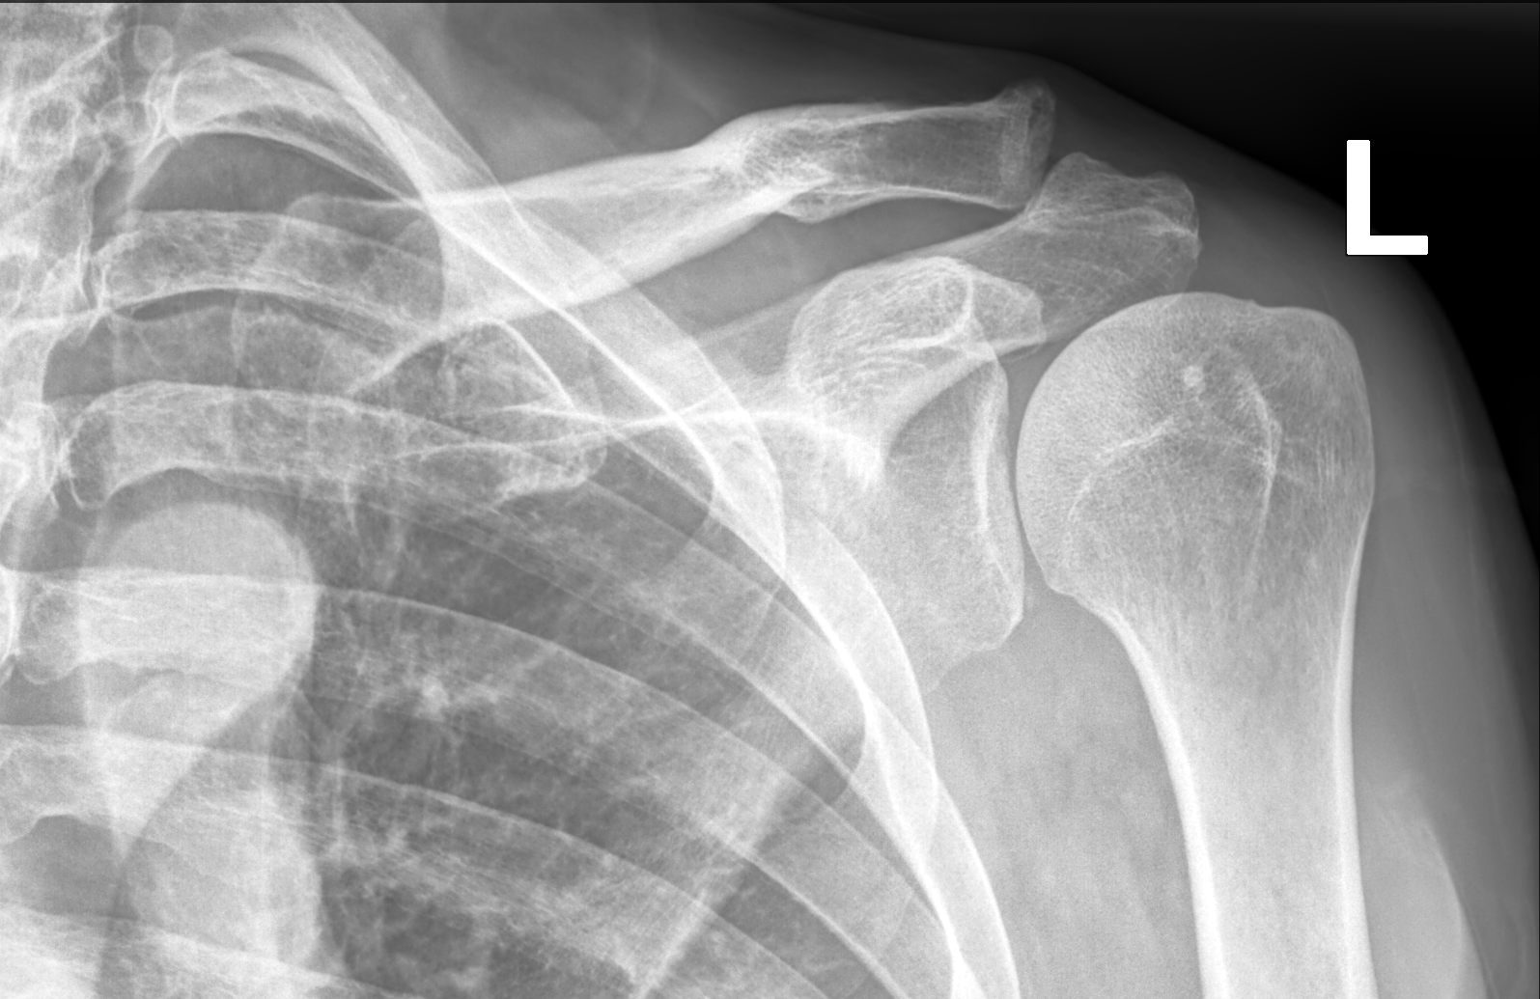

[shoulder axial]
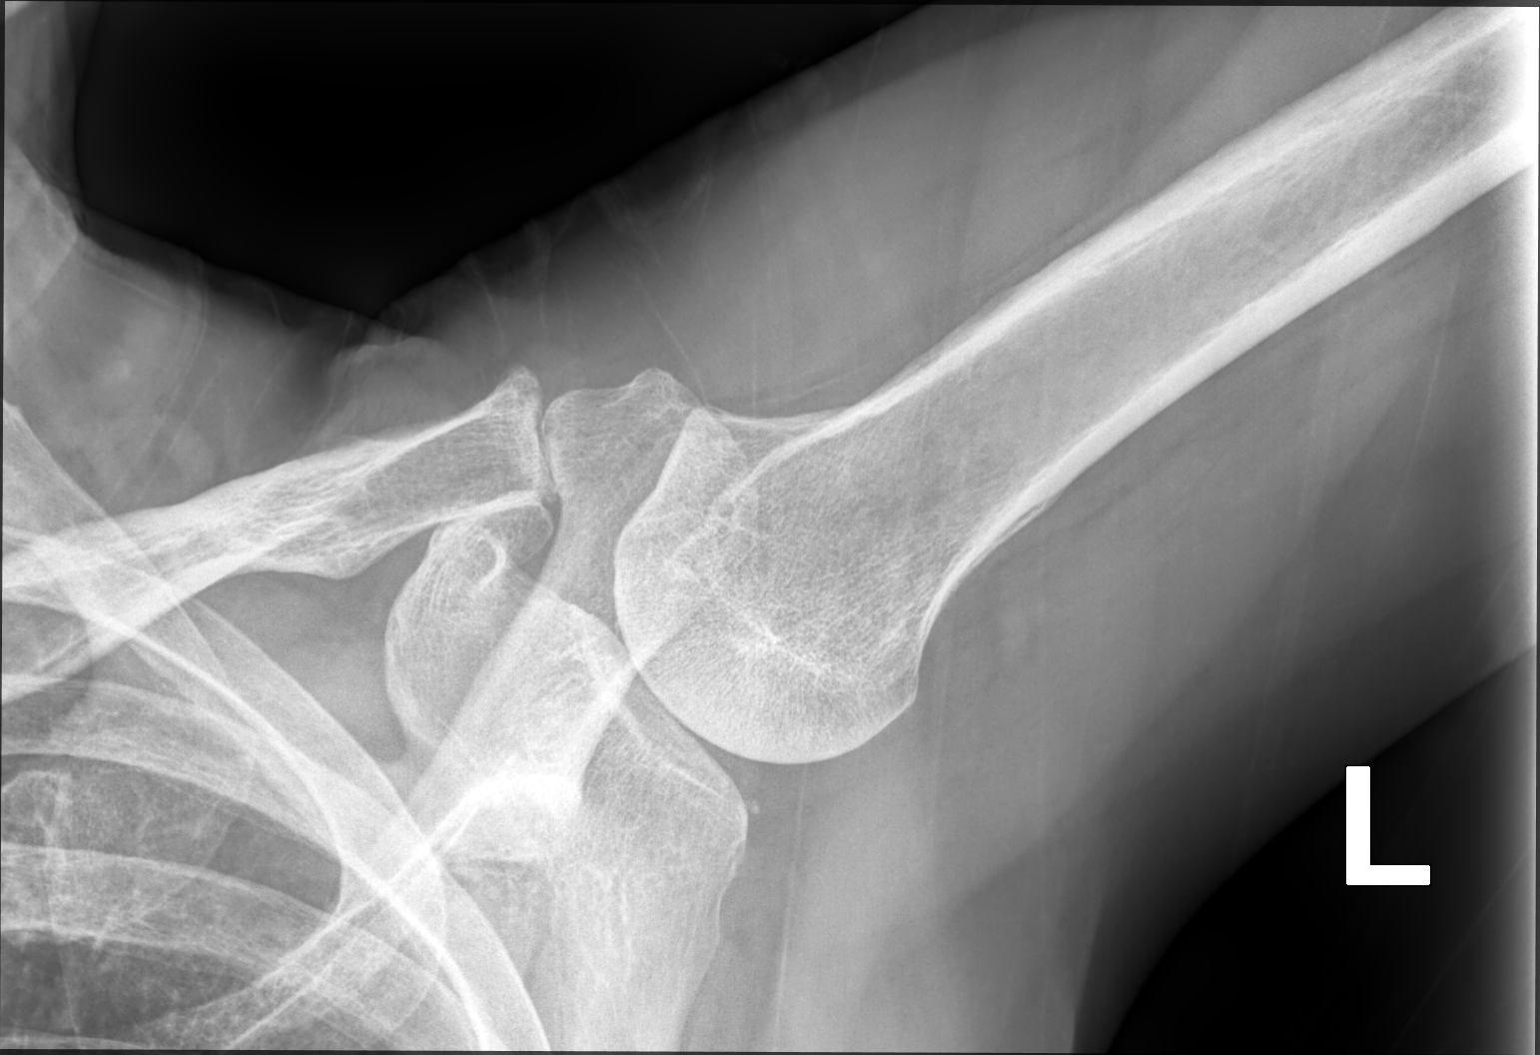

[shoulder y view]
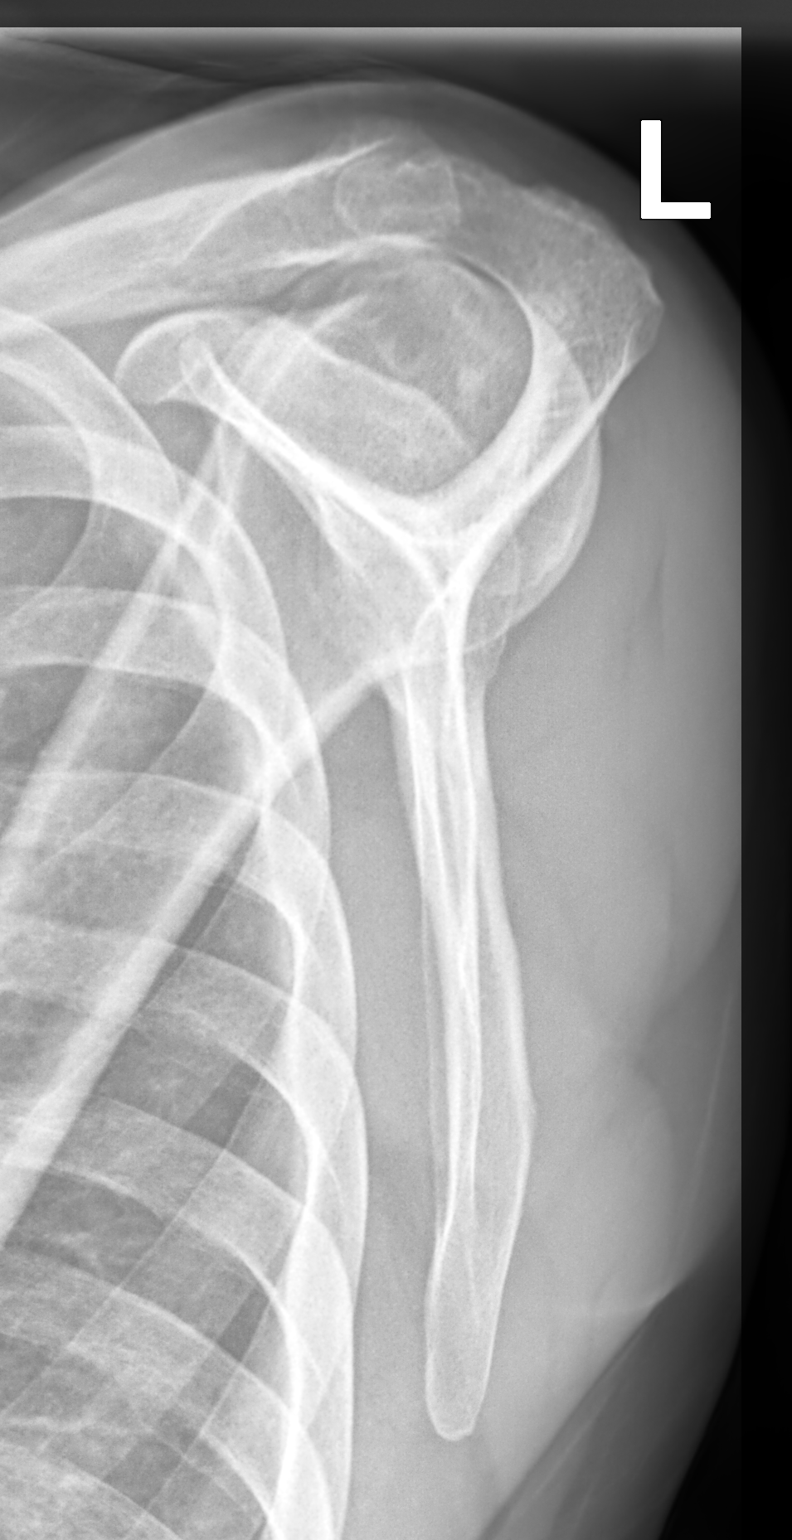

[3 of 3 positions shown; findings below may reference images not displayed]

FINDINGS: Normal alignment. No acute fracture or dislocation. Mild
acromioclavicular degenerative arthritis. Glenohumeral joint space
appears preserved. Limited evaluation of the left apex is
unremarkable.
IMPRESSION: Mild left acromioclavicular degenerative arthritis.

## 2022-02-18 ENCOUNTER — Encounter: Payer: Self-pay | Admitting: Family Medicine

## 2022-02-18 ENCOUNTER — Ambulatory Visit (INDEPENDENT_AMBULATORY_CARE_PROVIDER_SITE_OTHER): Payer: 59 | Admitting: Family Medicine

## 2022-02-18 VITALS — BP 144/80 | HR 72 | Temp 97.5°F | Ht 71.0 in | Wt 177.8 lb

## 2022-02-18 DIAGNOSIS — R030 Elevated blood-pressure reading, without diagnosis of hypertension: Secondary | ICD-10-CM

## 2022-02-18 DIAGNOSIS — Z0001 Encounter for general adult medical examination with abnormal findings: Secondary | ICD-10-CM

## 2022-02-18 DIAGNOSIS — N529 Male erectile dysfunction, unspecified: Secondary | ICD-10-CM | POA: Diagnosis not present

## 2022-02-18 DIAGNOSIS — Z23 Encounter for immunization: Secondary | ICD-10-CM | POA: Diagnosis not present

## 2022-02-18 DIAGNOSIS — Z0000 Encounter for general adult medical examination without abnormal findings: Secondary | ICD-10-CM

## 2022-02-18 LAB — URINALYSIS, ROUTINE W REFLEX MICROSCOPIC
Bilirubin Urine: NEGATIVE
Hgb urine dipstick: NEGATIVE
Ketones, ur: NEGATIVE
Leukocytes,Ua: NEGATIVE
Nitrite: NEGATIVE
RBC / HPF: NONE SEEN (ref 0–?)
Specific Gravity, Urine: <=1.005 — AB (ref 1.000–1.030)
Total Protein, Urine: NEGATIVE
Urine Glucose: NEGATIVE
Urobilinogen, UA: 0.2 (ref 0.0–1.0)
WBC, UA: NONE SEEN (ref 0–?)
pH: 6 (ref 5.0–8.0)

## 2022-02-18 LAB — COMPREHENSIVE METABOLIC PANEL
ALT: 30 U/L (ref 0–53)
AST: 30 U/L (ref 0–37)
Albumin: 4.6 g/dL (ref 3.5–5.2)
Alkaline Phosphatase: 55 U/L (ref 39–117)
BUN: 14 mg/dL (ref 6–23)
CO2: 29 mEq/L (ref 19–32)
Calcium: 9.5 mg/dL (ref 8.4–10.5)
Chloride: 96 mEq/L (ref 96–112)
Creatinine, Ser: 0.77 mg/dL (ref 0.40–1.50)
GFR: 100.28 mL/min (ref >60.00)
Glucose, Bld: 85 mg/dL (ref 70–99)
Potassium: 4.2 mEq/L (ref 3.5–5.1)
Sodium: 133 mEq/L — ABNORMAL LOW (ref 135–145)
Total Bilirubin: 0.6 mg/dL (ref 0.2–1.2)
Total Protein: 7.7 g/dL (ref 6.0–8.3)

## 2022-02-18 LAB — HEMOGLOBIN A1C: Hgb A1c MFr Bld: 5.7 % (ref 4.6–6.5)

## 2022-02-18 LAB — LIPID PANEL
Cholesterol: 209 mg/dL — ABNORMAL HIGH (ref 0–200)
HDL: 96.3 mg/dL (ref >39.00)
LDL Cholesterol: 101 mg/dL — ABNORMAL HIGH (ref 0–99)
NonHDL: 112.49
Total CHOL/HDL Ratio: 2
Triglycerides: 59 mg/dL (ref 0.0–149.0)
VLDL: 11.8 mg/dL (ref 0.0–40.0)

## 2022-02-18 LAB — CBC
HCT: 47 % (ref 39.0–52.0)
Hemoglobin: 15.6 g/dL (ref 13.0–17.0)
MCHC: 33.2 g/dL (ref 30.0–36.0)
MCV: 89.6 fl (ref 78.0–100.0)
Platelets: 224 10*3/uL (ref 150.0–400.0)
RBC: 5.25 Mil/uL (ref 4.22–5.81)
RDW: 13.5 % (ref 11.5–15.5)
WBC: 6.5 10*3/uL (ref 4.0–10.5)

## 2022-02-18 LAB — PSA: PSA: 1.03 ng/mL (ref 0.10–4.00)

## 2022-02-18 MED ORDER — SILDENAFIL CITRATE 20 MG PO TABS: ORAL_TABLET | ORAL | 0 refills | Status: DC

## 2022-02-18 NOTE — Progress Notes (Signed)
Established Patient Office Visit  Subjective   Patient ID: Dustin Mendez, male    DOB: Jan 23, 1966  Age: 56 y.o. MRN: [id number]  Chief Complaint  Patient presents with   Annual Exam    CPE, no concerns. Patient fasting.     HPI for yearly physical.  Doing well.  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  Exercises daily by walking his dog.  He has access to regular dental care but does not always come for cleanings.  Things are a little stressful in his personal life.  He and his wife are helping to care for his aging parents.  This keeps him busy throughout the week and on the weekends.  Starting to have some issues with ED.  There is trouble maintaining an erection.    Review of Systems  Constitutional: Negative.   HENT: Negative.    Eyes:  Negative for blurred vision, discharge and redness.  Respiratory: Negative.    Cardiovascular: Negative.   Gastrointestinal:  Negative for abdominal pain, blood in stool, constipation and melena.  Genitourinary: Negative.  Negative for dysuria, frequency, hematuria and urgency.  Musculoskeletal: Negative.  Negative for myalgias.  Skin:  Negative for rash.  Neurological:  Negative for tingling, loss of consciousness and weakness.  Endo/Heme/Allergies:  Negative for polydipsia.      02/18/2022    8:11 AM 02/16/2021    8:15 AM 02/15/2020   10:59 AM  Depression screen PHQ 2/9  Decreased Interest 0 0 0  Down, Depressed, Hopeless 0 0 0  PHQ - 2 Score 0 0 0  Altered sleeping   0  Tired, decreased energy   0  Change in appetite   0  Feeling bad or failure about yourself    0  Trouble concentrating   0  Moving slowly or fidgety/restless   0  Suicidal thoughts   0  PHQ-9 Score   0  Difficult doing work/chores   Not difficult at all        Objective:     BP (!) 142/84 (BP Location: Right Arm, Patient Position: Sitting, Cuff Size: Normal)   Pulse 72   Temp (!) 97.5 F (36.4 C) (Temporal)   Ht '5\' 11"'$  (1.803 m)   Wt 177 lb 12.8 oz  (80.6 kg)   SpO2 99%   BMI 24.80 kg/m  BP Readings from Last 3 Encounters:  02/18/22 (!) 142/84  04/02/21 (!) 142/82  03/05/21 138/80   Wt Readings from Last 3 Encounters:  02/18/22 177 lb 12.8 oz (80.6 kg)  04/02/21 180 lb (81.6 kg)  03/05/21 180 lb (81.6 kg)      Physical Exam Constitutional:      General: He is not in acute distress.    Appearance: Normal appearance. He is not ill-appearing, toxic-appearing or diaphoretic.  HENT:     Head: Normocephalic and atraumatic.     Right Ear: External ear normal.     Left Ear: External ear normal.     Mouth/Throat:     Mouth: Mucous membranes are moist.     Pharynx: Oropharynx is clear. No oropharyngeal exudate or posterior oropharyngeal erythema.  Eyes:     General: No scleral icterus.       Right eye: No discharge.        Left eye: No discharge.     Extraocular Movements: Extraocular movements intact.     Conjunctiva/sclera: Conjunctivae normal.     Pupils: Pupils are equal, round, and reactive to light.  Cardiovascular:  Rate and Rhythm: Normal rate and regular rhythm.  Pulmonary:     Effort: Pulmonary effort is normal. No respiratory distress.     Breath sounds: Normal breath sounds.  Abdominal:     General: Bowel sounds are normal. There is no distension.     Tenderness: There is no abdominal tenderness. There is no guarding.  Musculoskeletal:     Cervical back: No rigidity or tenderness.  Skin:    General: Skin is warm and dry.  Neurological:     Mental Status: He is alert and oriented to person, place, and time.  Psychiatric:        Mood and Affect: Mood normal.        Behavior: Behavior normal.      No results found for any visits on 02/18/22.    The 10-year ASCVD risk score (Arnett DK, et al., 2019) is: 4.9%    Assessment & Plan:   Problem List Items Addressed This Visit       Other   Healthcare maintenance - Primary   Relevant Orders   Urinalysis, Routine w reflex microscopic   PSA    Lipid panel   Hemoglobin A1c   Comprehensive metabolic panel   CBC   Need for influenza vaccination   Relevant Orders   Flu Vaccine QUAD 6+ mos PF IM (Fluarix Quad PF)   Elevated BP without diagnosis of hypertension   Other Visit Diagnoses     Vasculogenic erectile dysfunction, unspecified vasculogenic erectile dysfunction type       Relevant Medications   sildenafil (REVATIO) 20 MG tablet       Return in about 6 months (around 08/20/2022).  We will try sildenafil as needed for ED.  He will check and record his blood pressures 2-3 times weekly.  Ideally they would be in the 120/80 range.  Information was given on preventing hypertension as well as ED.   Libby Maw, MD

## 2022-04-23 ENCOUNTER — Telehealth: Payer: 59 | Admitting: Physician Assistant

## 2022-04-23 DIAGNOSIS — H6991 Unspecified Eustachian tube disorder, right ear: Secondary | ICD-10-CM | POA: Diagnosis not present

## 2022-04-23 MED ORDER — FLUTICASONE PROPIONATE 50 MCG/ACT NA SUSP: 2.0000 | Freq: Every day | NASAL | 0 refills | Status: DC

## 2022-04-23 NOTE — Progress Notes (Signed)
E-Visit for Ear Pain - Eustachian Tube Dysfunction   We are sorry that you are not feeling well. Here is how we plan to help!  Based on what you have shared with me it looks like you have Eustachian Tube Dysfunction.  Eustachian Tube Dysfunction is a condition where the tubes that connect your middle ears to your upper throat become blocked. This can lead to discomfort, hearing difficulties and a feeling of fullness in your ear. Eustachian tube dysfunction usually resolves itself in a few days. The usual symptoms include: Hearing problems Tinnitus, or ringing in your ears Clicking or popping sounds A feeling of fullness in your ears Pain that mimics an ear infection Dizziness, vertigo or balance problems A "tickling" sensation in your ears  ?Eustachian tube dysfunction symptoms may get worse in higher altitudes. This is called barotrauma, and it can happen while scuba diving, flying in an airplane or driving in the mountains.   What causes eustachian tube dysfunction? Allergies and infections (like the common cold and the flu) are the most common causes of eustachian tube dysfunction. These conditions can cause inflammation and mucus buildup, leading to blockage. GERD, or chronic acid reflux, can also cause ETD. This is because stomach acid can back up into your throat and result in inflammation. As mentioned above, altitude changes can also cause ETD.   What are some common eustachian tube dysfunction treatments? In most cases, treatment isn't necessary because ETD often resolves on its own. However, you might need treatment if your symptoms linger for more than two weeks.    Eustachian tube dysfunction treatment depends on the cause and the severity of your condition. Treatments may include home remedies, medications or, in severe cases, surgery.     HOME CARE: Sometimes simple home remedies can help with mild cases of eustachian tube dysfunction. To try and clear the blockage, you  can: Chew gum. Yawn. Swallow. Try the Valsalva maneuver (breathing out forcefully while closing your mouth and pinching your nostrils). Use a saline spray to clear out nasal passages.  MEDICATIONS: Over-the-counter medications can help if allergies are causing eustachian tube dysfunction. Try antihistamines (like cetirizine or diphenhydramine) to ease your symptoms. If you have discomfort, pain relievers -- such as acetaminophen or ibuprofen -- can help.  Sometimes intranasal glucocorticosteroids (like Flonase or Nasacort) help.  I have prescribed Fluticasone 50 mcg/spray 2 sprays in each nostril daily for 10-14 days    GET HELP RIGHT AWAY IF: Fever is over 102.2 degrees. You develop progressive ear pain or hearing loss. Ear symptoms persist longer than 3 days after treatment.  MAKE SURE YOU: Understand these instructions. Will watch your condition. Will get help right away if you are not doing well or get worse.  Thank you for choosing an e-visit.  Your e-visit answers were reviewed by a board certified advanced clinical practitioner to complete your personal care plan. Depending upon the condition, your plan could have included both over the counter or prescription medications.  Please review your pharmacy choice. Make sure the pharmacy is open so you can pick up the prescription now. If there is a problem, you may contact your provider through MyChart messaging and have the prescription routed to another pharmacy.  Your safety is important to us. If you have drug allergies check your prescription carefully.   For the next 24 hours you can use MyChart to ask questions about today's visit, request a non-urgent call back, or ask for a work or school excuse. You will   get an email with a survey after your eVisit asking about your experience. We would appreciate your feedback. I hope that your e-visit has been valuable and will aid in your recovery.

## 2022-04-23 NOTE — Progress Notes (Signed)
I have spent 5 minutes in review of e-visit questionnaire, review and updating patient chart, medical decision making and response to patient.   Cotina Freedman Cody Lorielle Boehning, PA-C    

## 2022-04-30 ENCOUNTER — Emergency Department (HOSPITAL_BASED_OUTPATIENT_CLINIC_OR_DEPARTMENT_OTHER): Payer: Commercial Managed Care - PPO

## 2022-04-30 ENCOUNTER — Other Ambulatory Visit: Payer: Self-pay

## 2022-04-30 ENCOUNTER — Encounter (HOSPITAL_BASED_OUTPATIENT_CLINIC_OR_DEPARTMENT_OTHER): Payer: Self-pay | Admitting: Emergency Medicine

## 2022-04-30 ENCOUNTER — Emergency Department (HOSPITAL_BASED_OUTPATIENT_CLINIC_OR_DEPARTMENT_OTHER)
Admission: EM | Admit: 2022-04-30 | Discharge: 2022-04-30 | Disposition: A | Discharge status: Home / Self Care | Payer: Commercial Managed Care - PPO | Attending: Emergency Medicine | Admitting: Emergency Medicine

## 2022-04-30 DIAGNOSIS — K222 Esophageal obstruction: Secondary | ICD-10-CM | POA: Diagnosis not present

## 2022-04-30 DIAGNOSIS — T18128A Food in esophagus causing other injury, initial encounter: Secondary | ICD-10-CM | POA: Diagnosis not present

## 2022-04-30 DIAGNOSIS — R1310 Dysphagia, unspecified: Secondary | ICD-10-CM | POA: Diagnosis not present

## 2022-04-30 MED ORDER — GLUCAGON HCL RDNA (DIAGNOSTIC) 1 MG IJ SOLR
1.0000 mg | Freq: Once | INTRAMUSCULAR | Status: DC
  Filled 2022-04-30: qty 1

## 2022-04-30 MED ORDER — ONDANSETRON HCL 4 MG/2ML IJ SOLN
4.0000 mg | Freq: Once | INTRAMUSCULAR | Status: DC
  Filled 2022-04-30: qty 2

## 2022-04-30 NOTE — Discharge Instructions (Addendum)
Thank you for coming to [facility] Emergency Department. You were seen for concern for meat stuck in your throat. We did an exam, and imaging, and these showed likely an esophageal food impaction. This was relieved by drinking soda. You can prevent this in the future by: Cutting your food into small pieces. Always sitting upright while eating. Removing bones from meat and fish. Avoiding dry meats Chewing your food well before swallowing. Not talking, laughing, or walking around while eating or swallowing.  Please follow up with your primary care provider within 1-2 weeks. They may need to refer you to GI. I have also provided the contact information for [facility] per your request.   Do not hesitate to return to the ED or call 911 if you experience: -Worsening symptoms -Lightheadedness, passing out -Fevers/chills -Anything else that concerns you

## 2022-04-30 NOTE — ED Provider Notes (Signed)
[facility] EMERGENCY DEPARTMENT Provider Note   CSN: [id number] Arrival date & time: 04/30/22  1334     History  Chief Complaint  Patient presents with   Foreign Body    Dustin Mendez is a 57 y.o. male with GERD, diverticulosis, carpal tunnel syndrome who presents with c/f esophageal foreign body.   Pt complains of piece of pork meat stuck around 6pm.  Didn't chew it well, swallowed, and felt it get stuck in his throat.  Tried drinking water, only able to get down a few "drops" at a time.  Feels spasms every time he tries.  Has happened before however usually is able to dislodge or get it down. Has never had to have an endoscopy. Denies shortness of breath or chest pain, difficulty breathing. No nausea.      Foreign Body      Home Medications Prior to Admission medications   Medication Sig Start Date End Date Taking? Authorizing Provider  fluticasone (FLONASE) 50 MCG/ACT nasal spray Place 2 sprays into both nostrils daily. 04/23/22   Brunetta Jeans, PA-C  sildenafil (REVATIO) 20 MG tablet May take 3 to 5 tablets 45 minutes prior as needed.  No more than 5 daily. 02/18/22   Libby Maw, MD  vitamin B-12 (CYANOCOBALAMIN) 1000 MCG tablet Take 1,000 mcg by mouth daily.    [provider]      Allergies    Patient has no known allergies.    Review of Systems   Review of Systems Review of systems Negative for f/c, chest pain.  A 10 point review of systems was performed and is negative unless otherwise reported in HPI.  Physical Exam Updated Vital Signs BP (!) 157/83 (BP Location: Right Arm)   Pulse 68   Temp 98 F (36.7 C) (Oral)   Resp 16   Ht '5\' 10"'$  (1.778 m)   Wt 79.4 kg   SpO2 100%   BMI 25.11 kg/m  Physical Exam General: Uncomfortable appearing male, sitting up  in bed.  HEENT: MMM, trachea midline. No oropharyngeal swelling, no FB Noted in oropharynx. Clear oropharynx. No trauma or bleeding noted in oral cavity. Tonsillar pillars  symmetric.  Cardiology: RRR, no murmurs/rubs/gallops.  Resp: Normal respiratory rate and effort. CTAB. NO increased WOB or stridor. Abd: Soft, non-tender, non-distended. No rebound tenderness or guarding.  GU: Deferred. MSK: No signs of trauma. Skin: warm, dry.  Neuro: A&Ox4, CNs II-XII grossly intact. MAEs. Psych: Normal mood and affect.   ED Results / Procedures / Treatments   Labs (all labs ordered are listed, but only abnormal results are displayed) Labs Reviewed - No data to display  EKG None  Radiology DG Chest Special View  Result Date: 04/30/2022 CLINICAL DATA:  Difficulty swallowing, evaluate for foreign body EXAM: CHEST SPECIAL VIEW COMPARISON:  PA and lateral views of chest done today FINDINGS: There is no evidence of air trapping in the lungs in this expiration radiograph. There is no shift of mediastinum. There are no opaque foreign bodies. IMPRESSION: No active disease is seen. There is no evidence of any air trapping. There is no shift of mediastinum. There are no opaque foreign bodies. Electronically Signed   By: Elmer Picker M.D.   On: 04/30/2022 14:50   DG Chest 2 View  Result Date: 04/30/2022 CLINICAL DATA:  Difficulty swallowing, evaluate for foreign body EXAM: CHEST - 2 VIEW COMPARISON:  None Available. FINDINGS: The heart size and mediastinal contours are within normal limits. Both lungs  are clear. The visualized skeletal structures are unremarkable. IMPRESSION: No active cardiopulmonary disease. Electronically Signed   By: Elmer Picker M.D.   On: 04/30/2022 14:45    Procedures Procedures    Medications Ordered in ED Medications  glucagon (human recombinant) (GLUCAGEN) injection 1 mg (1 mg Intravenous Patient Refused/Not Given 04/30/22 2019)  ondansetron (ZOFRAN) injection 4 mg (4 mg Intravenous Patient Refused/Not Given 04/30/22 2019)    ED Course/ Medical Decision Making/ A&P                          Medical Decision Making   This patient  presents to the ED for concern of esophageal obstruction, he is uncomfortable but overall HDS and afebrile.  MDM:    Consider esophageal foreign body, food bolus. Seems to clinically have complete obstruction of esophagus. Given that it was pork, xrays obtained from triage to r/o swallowed bone, which were negative. Patient without any respiratory distress or stridor, low c/f aspiration of food or object. Patient uncomfortable and unable to swallow any water. Hasn't tried soda. Will attempt soda and give IV glucagon as well as zofran for possible side effect of N/V. If unable to get down food bolus will talk to GI for endoscopy.  Clinical Course as of 04/30/22 2019  Tue Apr 30, 2022  1941 Will try glucagon '1mg'$  IV with zofran IV and soda [HN]  1941 DG Chest 2 View FINDINGS: The heart size and mediastinal contours are within normal limits. Both lungs are clear. The visualized skeletal structures are unremarkable.  IMPRESSION: No active cardiopulmonary disease.   [HN]  1941 DG Chest Special View FINDINGS: There is no evidence of air trapping in the lungs in this expiration radiograph. There is no shift of mediastinum. There are no opaque foreign bodies.  IMPRESSION: No active disease is seen. There is no evidence of any air trapping. There is no shift of mediastinum. There are no opaque foreign bodies.   [HN]  2012 Had brought patient soda and ordered IV zofran/glucagon, but after drinking the soda, patient reported complete resolution of his symptoms. Requests a GI Referral. Will give patient info for [facility] but also state he may need a referral from his PCP. Patient reports understanding. Advised to avoid dry meats and chew his food well. DC w/ discharge instructions/return precautions. [HN]    Clinical Course User Index [HN] Audley Hose, MD    Imaging Studies ordered: I ordered imaging studies including CXR I independently visualized and interpreted imaging. I agree with  the radiologist interpretation   Reevaluation: After the interventions noted above, I reevaluated the patient and found that they have :resolved  Social Determinants of Health: Patient lives independently   Disposition:  DC w/ discharge instructions/return precautions  Co morbidities that complicate the patient evaluation  Past Medical History:  Diagnosis Date   Allergy    Arthritis    CTS (carpal tunnel syndrome) 10/21/2014   Diverticulosis 12/31/2016   Gastro-esophageal reflux 11/06/2014   pt denies   Hx of colonic polyp 12/31/2016   Muscle cramps 12/31/2016   Neck pain 10/21/2014     Medicines Meds ordered this encounter  Medications   glucagon (human recombinant) (GLUCAGEN) injection 1 mg   ondansetron (ZOFRAN) injection 4 mg    I have reviewed the patients home medicines and have made adjustments as needed  Problem List / ED Course: Problem List Items Addressed This Visit   None Visit Diagnoses  Esophageal obstruction due to food impaction    -  Primary                   This note was created using dictation software, which may contain spelling or grammatical errors.    Audley Hose, MD 04/30/22 [phone]

## 2022-04-30 NOTE — ED Triage Notes (Signed)
Pt reports bone stuck in throat since last pm; able to drink some liquid slowly; unable to eat food

## 2022-04-30 NOTE — ED Provider Triage Note (Signed)
Emergency Medicine Provider Triage Evaluation Note  Dustin Mendez , a 57 y.o. male  was evaluated in triage.  Pt complains of piece of meat stuck around 6pm.  Didn't chew it well, swallowed, and felt it get stuck.  Tried drinking water, only able to get down a few "drops" at a time.  Feels spasms every time he tries.  Has happened before however usually is able to dislodge or get it down.  Denies shortness of breath or chest pain.  Review of Systems  Positive:  Negative: See above  Physical Exam  BP (!) 153/92 (BP Location: Right Arm)   Pulse 87   Temp 98.6 F (37 C)   Resp 18   Ht '5\' 10"'$  (1.778 m)   Wt 79.4 kg   SpO2 97%   BMI 25.11 kg/m  Gen:   Awake, no distress   Resp:  Normal effort  MSK:   Moves extremities without difficulty  Other:  Sitting comfortably.  Handling own secretions.  Breathing and communicating without difficulty.  Medical Decision Making  Medically screening exam initiated at 4:07 PM.  Appropriate orders placed.  Barbra Sarks was informed that the remainder of the evaluation will be completed by another provider, this initial triage assessment does not replace that evaluation, and the importance of remaining in the ED until their evaluation is complete.     Prince Rome, PA-C 69/79/48 1609

## 2022-05-26 ENCOUNTER — Other Ambulatory Visit: Payer: Self-pay | Admitting: Family Medicine

## 2022-05-26 DIAGNOSIS — N529 Male erectile dysfunction, unspecified: Secondary | ICD-10-CM

## 2022-05-27 MED ORDER — SILDENAFIL CITRATE 20 MG PO TABS: ORAL_TABLET | ORAL | 0 refills | Status: DC

## 2022-08-12 ENCOUNTER — Encounter: Payer: Self-pay | Admitting: *Deleted

## 2022-08-19 ENCOUNTER — Encounter: Payer: Self-pay | Admitting: Family Medicine

## 2022-08-19 ENCOUNTER — Ambulatory Visit (INDEPENDENT_AMBULATORY_CARE_PROVIDER_SITE_OTHER): Payer: 59 | Admitting: Family Medicine

## 2022-08-19 VITALS — BP 158/92 | HR 80 | Temp 97.6°F | Ht 70.0 in | Wt 176.2 lb

## 2022-08-19 DIAGNOSIS — I10 Essential (primary) hypertension: Secondary | ICD-10-CM | POA: Insufficient documentation

## 2022-08-19 DIAGNOSIS — L84 Corns and callosities: Secondary | ICD-10-CM | POA: Diagnosis not present

## 2022-08-19 MED ORDER — LISINOPRIL 20 MG PO TABS: 20.0000 mg | ORAL_TABLET | Freq: Every day | ORAL | 3 refills | Status: DC

## 2022-08-19 NOTE — Progress Notes (Signed)
Established Patient Office Visit   Subjective:  Patient ID: Dustin Mendez, male    DOB: 1965/06/10  Age: 57 y.o. MRN: [id number]  Chief Complaint  Patient presents with   Medical Management of Chronic Issues    6 month f/u.  Average BP 120/77 -157/92. C/o having heel pain.     HPI Encounter Diagnoses  Name Primary?   Essential hypertension Yes   Callus of heel    For follow-up of elevated blood pressure and a pain in his left heel.  Heel injury years ago.  He has had a sore spot on his left heel for some time now.  Blood pressure at home runs 120s to 150s over 80s to 90s.  He has no blurred vision or shortness of breath.  He is careful about sodium intake.   Review of Systems  Constitutional: Negative.   HENT: Negative.    Eyes:  Negative for blurred vision, discharge and redness.  Respiratory: Negative.    Cardiovascular: Negative.   Gastrointestinal:  Negative for abdominal pain.  Genitourinary: Negative.   Musculoskeletal: Negative.  Negative for myalgias.  Skin:  Negative for rash.  Neurological:  Negative for tingling, loss of consciousness and weakness.  Endo/Heme/Allergies:  Negative for polydipsia.     Current Outpatient Medications:    fluticasone (FLONASE) 50 MCG/ACT nasal spray, Place 2 sprays into both nostrils daily., Disp: 16 g, Rfl: 0   lisinopril (ZESTRIL) 20 MG tablet, Take 1 tablet (20 mg total) by mouth daily., Disp: 90 tablet, Rfl: 3   sildenafil (REVATIO) 20 MG tablet, May take 3 to 5 tablets 45 minutes prior as needed.  No more than 5 daily., Disp: 10 tablet, Rfl: 0   vitamin B-12 (CYANOCOBALAMIN) 1000 MCG tablet, Take 1,000 mcg by mouth daily., Disp: , Rfl:    Objective:     BP (!) 158/92   Pulse 80   Temp 97.6 F (36.4 C) (Temporal)   Ht  (1.778 m)   Wt 176 lb 3.2 oz (79.9 kg)   SpO2 97%   BMI 25.28 kg/m  BP Readings from Last 3 Encounters:  08/19/22 (!) 158/92  04/30/22 (!) 150/80  02/18/22 (!) 144/80   Wt Readings from Last 3  Encounters:  08/19/22 176 lb 3.2 oz (79.9 kg)  04/30/22 175 lb (79.4 kg)  02/18/22 177 lb 12.8 oz (80.6 kg)      Physical Exam Constitutional:      General: He is not in acute distress.    Appearance: Normal appearance. He is not ill-appearing, toxic-appearing or diaphoretic.  HENT:     Head: Normocephalic and atraumatic.     Right Ear: External ear normal.     Left Ear: External ear normal.  Eyes:     General: No scleral icterus.       Right eye: No discharge.        Left eye: No discharge.     Extraocular Movements: Extraocular movements intact.     Conjunctiva/sclera: Conjunctivae normal.  Cardiovascular:     Rate and Rhythm: Normal rate and regular rhythm.  Pulmonary:     Effort: Pulmonary effort is normal. No respiratory distress.     Breath sounds: Normal breath sounds.  Musculoskeletal:       Legs:  Skin:    General: Skin is warm and dry.  Neurological:     Mental Status: He is alert and oriented to person, place, and time.  Psychiatric:  Mood and Affect: Mood normal.        Behavior: Behavior normal.      No results found for any visits on 08/19/22.    The 10-year ASCVD risk score (Arnett DK, et al., 2019) is: 6.2%    Assessment & Plan:   Essential hypertension -     Lisinopril; Take 1 tablet (20 mg total) by mouth daily.  Dispense: 90 tablet; Refill: 3  Callus of heel -     Ambulatory referral to Podiatry    Return in about 2 months (around 10/19/2022), or bring your bp cuff with you please..  Advised him to consume no more than 2 alcoholic drinks daily.  Information was given on managing hypertension and lisinopril.  Look out for cough.  Information given on calluses.  Mliss Sax, MD

## 2022-08-29 ENCOUNTER — Encounter: Payer: Self-pay | Admitting: Podiatry

## 2022-08-29 ENCOUNTER — Ambulatory Visit: Payer: 59 | Admitting: Podiatry

## 2022-08-29 ENCOUNTER — Ambulatory Visit: Payer: 59

## 2022-08-29 DIAGNOSIS — B070 Plantar wart: Secondary | ICD-10-CM

## 2022-08-29 MED ORDER — FLUOROURACIL 5 % EX CREA: TOPICAL_CREAM | Freq: Two times a day (BID) | CUTANEOUS | 1 refills | Status: DC

## 2022-08-29 NOTE — Progress Notes (Signed)
  Subjective:  Patient ID: Dustin Mendez, male    DOB: 12-15-1965,  MRN: [id number] HPI Chief Complaint  Patient presents with   Foot Pain    Left heel - callused lesion x 2 months, tender at times, been filing on it   New Patient (Initial Visit)    57 y.o. male presents with the above complaint.   ROS: Denies fever chills nausea vomit muscle aches pains calf pain back pain chest pain shortness of breath.  Past Medical History:  Diagnosis Date   Allergy    Arthritis    CTS (carpal tunnel syndrome) 10/21/2014   Diverticulosis 12/31/2016   Gastro-esophageal reflux 11/06/2014   pt denies   Hx of colonic polyp 12/31/2016   Muscle cramps 12/31/2016   Neck pain 10/21/2014   Past Surgical History:  Procedure Laterality Date   APPENDECTOMY     done as a teenager   HERNIA REPAIR     umbilical   TYMPANOSTOMY TUBE PLACEMENT Bilateral    childhood    Current Outpatient Medications:    fluorouracil (EFUDEX) 5 % cream, Apply topically 2 (two) times daily., Disp: 40 g, Rfl: 1   fluticasone (FLONASE) 50 MCG/ACT nasal spray, Place 2 sprays into both nostrils daily., Disp: 16 g, Rfl: 0   lisinopril (ZESTRIL) 20 MG tablet, Take 1 tablet (20 mg total) by mouth daily., Disp: 90 tablet, Rfl: 3   sildenafil (REVATIO) 20 MG tablet, May take 3 to 5 tablets 45 minutes prior as needed.  No more than 5 daily., Disp: 10 tablet, Rfl: 0   vitamin B-12 (CYANOCOBALAMIN) 1000 MCG tablet, Take 1,000 mcg by mouth daily., Disp: , Rfl:   No Known Allergies Review of Systems Objective:  There were no vitals filed for this visit.  General: Well developed, nourished, in no acute distress, alert and oriented x3   Dermatological: Skin is warm, dry and supple bilateral. Nails x 10 are well maintained; remaining integument appears unremarkable at this time. There are no open sores, no preulcerative lesions, no rash or signs of infection present.  A verrucoid lesion is present at the plantar lateral aspect of the left  heel measuring 0.7 cm in diameter thrombosed capillaries are visible as well as skin lines circumvent the lesion both of which are consistent with verruca plantaris.  Vascular: Dorsalis Pedis artery and Posterior Tibial artery pedal pulses are 2/4 bilateral with immedate capillary fill time. Pedal hair growth present. No varicosities and no lower extremity edema present bilateral.   Neruologic: Grossly intact via light touch bilateral. Vibratory intact via tuning fork bilateral. Protective threshold with Semmes Wienstein monofilament intact to all pedal sites bilateral. Patellar and Achilles deep tendon reflexes 2+ bilateral. No Babinski or clonus noted bilateral.   Musculoskeletal: No gross boney pedal deformities bilateral. No pain, crepitus, or limitation noted with foot and ankle range of motion bilateral. Muscular strength 5/5 in all groups tested bilateral.  Gait: Unassisted, Nonantalgic.    Radiographs:  None taken  Assessment & Plan:   Assessment: Verruca plantaris left heel.  Plan: Discussed etiology pathology and surgical therapies at this point placed Cantharone under occlusion to be washed off thoroughly tomorrow prescribed Efudex cream will follow-up with him in 6 weeks     Soniya Ashraf T. [place], [place]

## 2022-09-24 ENCOUNTER — Other Ambulatory Visit: Payer: Self-pay | Admitting: Family Medicine

## 2022-09-24 DIAGNOSIS — N529 Male erectile dysfunction, unspecified: Secondary | ICD-10-CM

## 2022-10-08 ENCOUNTER — Ambulatory Visit: Payer: 59 | Admitting: Podiatry

## 2022-10-18 ENCOUNTER — Ambulatory Visit (INDEPENDENT_AMBULATORY_CARE_PROVIDER_SITE_OTHER): Payer: 59 | Admitting: Family Medicine

## 2022-10-18 ENCOUNTER — Encounter: Payer: Self-pay | Admitting: Family Medicine

## 2022-10-18 VITALS — BP 126/82 | HR 89 | Temp 98.1°F | Wt 173.2 lb

## 2022-10-18 DIAGNOSIS — I10 Essential (primary) hypertension: Secondary | ICD-10-CM | POA: Diagnosis not present

## 2022-10-18 LAB — BASIC METABOLIC PANEL
BUN: 11 mg/dL (ref 6–23)
CO2: 29 mEq/L (ref 19–32)
Calcium: 9.2 mg/dL (ref 8.4–10.5)
Chloride: 100 mEq/L (ref 96–112)
Creatinine, Ser: 0.79 mg/dL (ref 0.40–1.50)
GFR: 99.05 mL/min (ref >60.00)
Glucose, Bld: 84 mg/dL (ref 70–99)
Potassium: 4.5 mEq/L (ref 3.5–5.1)
Sodium: 138 mEq/L (ref 135–145)

## 2022-10-18 MED ORDER — LISINOPRIL 20 MG PO TABS: 20.0000 mg | ORAL_TABLET | Freq: Every day | ORAL | 3 refills | Status: DC

## 2022-10-18 NOTE — Progress Notes (Signed)
   Established Patient Office Visit   Subjective:  Patient ID: Dustin Mendez, male    DOB: 04-25-1966  Age: 58 y.o. MRN: [id number]  Chief Complaint  Patient presents with   Follow-up    Follow up - Blood Pressure. Pt states his blood readings at home have been much better since starting Lisinopril. No concerns or questions.     HPI Encounter Diagnoses  Name Primary?   Essential hypertension Yes   Doing well with the lisinopril.  No cough.  No family history of diabetes.  Enjoys sweet tea.   Review of Systems  Constitutional: Negative.   HENT: Negative.    Eyes:  Negative for blurred vision, discharge and redness.  Respiratory: Negative.    Cardiovascular: Negative.   Gastrointestinal:  Negative for abdominal pain.  Genitourinary: Negative.   Musculoskeletal: Negative.  Negative for myalgias.  Skin:  Negative for rash.  Neurological:  Negative for tingling, loss of consciousness and weakness.  Endo/Heme/Allergies:  Negative for polydipsia.     Current Outpatient Medications:    fluticasone (FLONASE) 50 MCG/ACT nasal spray, Place 2 sprays into both nostrils daily., Disp: 16 g, Rfl: 0   sildenafil (REVATIO) 20 MG tablet, TAKE 3-5 TABLETS 45 MINUTES PRIOR AS NEEDED. DONT TAKE MORE THAN 5 TABLETS DAILY, Disp: 50 tablet, Rfl: 2   vitamin B-12 (CYANOCOBALAMIN) 1000 MCG tablet, Take 1,000 mcg by mouth daily., Disp: , Rfl:    fluorouracil (EFUDEX) 5 % cream, Apply topically 2 (two) times daily. (Patient not taking: Reported on 10/18/2022), Disp: 40 g, Rfl: 1   lisinopril (ZESTRIL) 20 MG tablet, Take 1 tablet (20 mg total) by mouth daily., Disp: 90 tablet, Rfl: 3   Objective:     BP 126/82   Pulse 89   Temp 98.1 F (36.7 C)   Wt 173 lb 3.2 oz (78.6 kg)   BMI 24.85 kg/m    Physical Exam Constitutional:      General: He is not in acute distress.    Appearance: Normal appearance. He is not ill-appearing, toxic-appearing or diaphoretic.  HENT:     Head: Normocephalic and  atraumatic.     Right Ear: External ear normal.     Left Ear: External ear normal.  Eyes:     General: No scleral icterus.       Right eye: No discharge.        Left eye: No discharge.     Extraocular Movements: Extraocular movements intact.     Conjunctiva/sclera: Conjunctivae normal.  Pulmonary:     Effort: Pulmonary effort is normal. No respiratory distress.  Skin:    General: Skin is warm and dry.  Neurological:     Mental Status: He is alert and oriented to person, place, and time.  Psychiatric:        Mood and Affect: Mood normal.        Behavior: Behavior normal.      No results found for any visits on 10/18/22.    The 10-year ASCVD risk score (Arnett DK, et al., 2019) is: 4.2%    Assessment & Plan:   Essential hypertension -     Basic metabolic panel -     Lisinopril; Take 1 tablet (20 mg total) by mouth daily.  Dispense: 90 tablet; Refill: 3    Return in about 4 months (around 02/17/2023).  Continue lisinopril 20.  Information given on managing hypertension and preventing type 2 diabetes.  Mliss Sax, MD

## 2023-08-07 ENCOUNTER — Ambulatory Visit (INDEPENDENT_AMBULATORY_CARE_PROVIDER_SITE_OTHER): Admitting: Family Medicine

## 2023-08-07 ENCOUNTER — Encounter: Payer: Self-pay | Admitting: Family Medicine

## 2023-08-07 VITALS — BP 134/80 | HR 77 | Temp 97.3°F | Ht 70.0 in | Wt 180.0 lb

## 2023-08-07 DIAGNOSIS — R1314 Dysphagia, pharyngoesophageal phase: Secondary | ICD-10-CM

## 2023-08-07 NOTE — Progress Notes (Signed)
 Established Patient Office Visit   Subjective:  Patient ID: Dustin Mendez, male    DOB: 04/01/1966  Age: 58 y.o. MRN: [id number]  Chief Complaint  Patient presents with   Dysphagia    Pt states he is having difficulty swallowing when eating or taking pills x years but has increased more.     HPI Encounter Diagnoses  Name Primary?   Pharyngoesophageal dysphagia Yes   For follow-up of an ongoing history of dysphagia over the last 10 years it seems to be worsening.  He notes that it tends to occur in his esophagus at the base of the neck.  There have been no emergency room visits for this.  Solid foods and large pills are more likely to incite.  He has no issues with liquids.  Denies liquids associated with dysphagia.  He has never smoked but he is vaping.  He did use [place] for a period of time.  He consumes 1-2 beers daily.  Denies any history of reflux, increased burping or flatulence.  He does have ongoing postnasal drip.  He has not tolerated allergy medicines or nasal steroids.   Review of Systems  Constitutional: Negative.   HENT: Negative.    Eyes:  Negative for blurred vision, discharge and redness.  Respiratory: Negative.    Cardiovascular: Negative.   Gastrointestinal:  Negative for abdominal pain, heartburn, nausea and vomiting.  Genitourinary: Negative.   Musculoskeletal: Negative.  Negative for myalgias.  Skin:  Negative for rash.  Neurological:  Negative for tingling, loss of consciousness and weakness.  Endo/Heme/Allergies:  Negative for polydipsia.     Current Outpatient Medications:    lisinopril (ZESTRIL) 20 MG tablet, Take 1 tablet (20 mg total) by mouth daily., Disp: 90 tablet, Rfl: 3   sildenafil (REVATIO) 20 MG tablet, TAKE 3-5 TABLETS 45 MINUTES PRIOR AS NEEDED. DONT TAKE MORE THAN 5 TABLETS DAILY, Disp: 50 tablet, Rfl: 2   vitamin B-12 (CYANOCOBALAMIN) 1000 MCG tablet, Take 1,000 mcg by mouth daily., Disp: , Rfl:    fluorouracil (EFUDEX) 5 % cream,  Apply topically 2 (two) times daily. (Patient not taking: Reported on 08/07/2023), Disp: 40 g, Rfl: 1   fluticasone (FLONASE) 50 MCG/ACT nasal spray, Place 2 sprays into both nostrils daily. (Patient not taking: Reported on 08/07/2023), Disp: 16 g, Rfl: 0   Objective:     BP 134/80 (BP Location: Right Arm, Patient Position: Sitting, Cuff Size: Normal)   Pulse 77   Temp (!) 97.3 F (36.3 C) (Temporal)   Ht 5\' 10"  (1.778 m)   Wt 180 lb (81.6 kg)   SpO2 95%   BMI 25.83 kg/m    Physical Exam Constitutional:      General: He is not in acute distress.    Appearance: Normal appearance. He is not ill-appearing, toxic-appearing or diaphoretic.  HENT:     Head: Normocephalic and atraumatic.     Right Ear: External ear normal.     Left Ear: External ear normal.     Mouth/Throat:     Mouth: Mucous membranes are moist.     Pharynx: Oropharynx is clear. No oropharyngeal exudate or posterior oropharyngeal erythema.  Eyes:     General: No scleral icterus.       Right eye: No discharge.        Left eye: No discharge.     Extraocular Movements: Extraocular movements intact.     Conjunctiva/sclera: Conjunctivae normal.     Pupils: Pupils are equal, round, and reactive to  light.  Cardiovascular:     Rate and Rhythm: Normal rate and regular rhythm.  Pulmonary:     Effort: Pulmonary effort is normal. No respiratory distress.     Breath sounds: Normal breath sounds. No wheezing or rales.  Abdominal:     General: Bowel sounds are normal.     Tenderness: There is no abdominal tenderness. There is no guarding or rebound.  Musculoskeletal:     Cervical back: No rigidity or tenderness.  Lymphadenopathy:     Cervical: No cervical adenopathy.  Skin:    General: Skin is warm and dry.  Neurological:     Mental Status: He is alert and oriented to person, place, and time.  Psychiatric:        Mood and Affect: Mood normal.        Behavior: Behavior normal.      No results found for any visits on  08/07/23.    The 10-year ASCVD risk score (Arnett DK, et al., 2019) is: 5.2%    Assessment & Plan:   Pharyngoesophageal dysphagia -     Ambulatory referral to Gastroenterology    Return for annual physical.    Mliss Sax, MD

## 2023-08-27 ENCOUNTER — Encounter: Payer: Self-pay | Admitting: Nurse Practitioner

## 2023-10-13 ENCOUNTER — Other Ambulatory Visit: Payer: Self-pay | Admitting: Family Medicine

## 2023-10-13 DIAGNOSIS — I10 Essential (primary) hypertension: Secondary | ICD-10-CM

## 2023-10-19 ENCOUNTER — Other Ambulatory Visit: Payer: Self-pay | Admitting: Family Medicine

## 2023-10-19 DIAGNOSIS — N529 Male erectile dysfunction, unspecified: Secondary | ICD-10-CM

## 2023-10-28 ENCOUNTER — Encounter: Payer: Self-pay | Admitting: Nurse Practitioner

## 2023-10-28 ENCOUNTER — Ambulatory Visit: Admitting: Nurse Practitioner

## 2023-10-28 VITALS — BP 118/76 | HR 73 | Ht 70.0 in | Wt 178.1 lb

## 2023-10-28 DIAGNOSIS — R1310 Dysphagia, unspecified: Secondary | ICD-10-CM | POA: Diagnosis not present

## 2023-10-28 DIAGNOSIS — K5730 Diverticulosis of large intestine without perforation or abscess without bleeding: Secondary | ICD-10-CM

## 2023-10-28 DIAGNOSIS — Z860100 Personal history of colon polyps, unspecified: Secondary | ICD-10-CM | POA: Diagnosis not present

## 2023-10-28 MED ORDER — NA SULFATE-K SULFATE-MG SULF 17.5-3.13-1.6 GM/177ML PO SOLN: 1.0000 | Freq: Once | ORAL | 0 refills | Status: AC

## 2023-10-28 NOTE — Progress Notes (Signed)
 Agree with assessment and plan as outlined.

## 2023-10-28 NOTE — Patient Instructions (Addendum)
 You have been scheduled for an endoscopy and colonoscopy. Please follow the written instructions given to you at your visit today.  If you use inhalers (even only as needed), please bring them with you on the day of your procedure.  DO NOT TAKE 7 DAYS PRIOR TO TEST- Trulicity (dulaglutide) Ozempic, Wegovy (semaglutide) Mounjaro (tirzepatide) Bydureon Bcise (exanatide extended release)  DO NOT TAKE 1 DAY PRIOR TO YOUR TEST Rybelsus (semaglutide) Adlyxin (lixisenatide) Victoza (liraglutide) Byetta (exanatide) ______________________________________________________  Please contact your PCP to schedule an annual physical with labs.  Thank you for entrusting me with your care and for choosing [employer], [place], CRNP   If your blood pressure at your visit was 140/90 or greater, please contact your primary care physician to follow up on this. ______________________________________________________  If you are age 63 or older, your body mass index should be between 23-30. Your Body mass index is 25.56 kg/m. If this is out of the aforementioned range listed, please consider follow up with your Primary Care Provider.  If you are age 12 or younger, your body mass index should be between 19-25. Your Body mass index is 25.56 kg/m. If this is out of the aformentioned range listed, please consider follow up with your Primary Care Provider.  ________________________________________________________  The [facility] providers would like to encourage you to use MYCHART to communicate with providers for non-urgent requests or questions.  Due to long hold times on the telephone, sending your provider a message by [facility] may be a faster and more efficient way to get a response.  Please allow 48 business hours for a response.  Please remember that this is for non-urgent requests.  _______________________________________________________  Due to recent changes in healthcare laws, you  may see the results of your imaging and laboratory studies on MyChart before your provider has had a chance to review them.  We understand that in some cases there may be results that are confusing or concerning to you. Not all laboratory results come back in the same time frame and the provider may be waiting for multiple results in order to interpret others.  Please give us  48 hours in order for your provider to thoroughly review all the results before contacting the office for clarification of your results.

## 2023-10-28 NOTE — Progress Notes (Signed)
 10/28/2023 Dustin Mendez [id number] 03/28/1966   CHIEF COMPLAINT: Dysphagia   HISTORY OF PRESENT ILLNESS: Dustin Mendez is a 58 year old male with a past medical history of arthritis and diverticulosis  Past appendectomy and umbilical hernia repair. He presents to our office today as referred by Dr. Elsie Mendez for further evaluation regarding dysphagia. He is known by Dr. Leigh. He describes having meat or thick foods like grits gets hung in throat which is painful and often results in gagging and vomiting up the stuck food. He sometimes drinks a lot of water to push food down. On 04/30/2022, food was stuck in his throat for an entire day and he went to the ED for further evaluation. He drank ginger ale and the stuck food passed down without requiring endoscopic intervention. He stated his swallowing difficulties started about 10 years ago and occurs approximately 3 times monthly, sometimes goes away for long stretches of time. He denies having a prior history of GERD. No recent heartburn.  No upper or lower abdominal pain. He is passing normal formed brown bowel movements daily.  No rectal bleeding.  No black stools.  He endorses having intermittent sinus drainage and wonders if this contributes to his swallowing issues. He underwent a colonoscopy 01/17/2016 which identified two 3 to 5 mm polyps removed from the transverse colon, path report showed benign polypoid colonic mucosa without adenomatous change. 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 He was referred to surgeon Dr. Elspeth Mendez for anal biopsy, however, the patient did not pursue this evaluation. He denies having any anal rectal pain or obvious anal lesions. A repeat colonoscopy in one year was recommended, was not done. No known family history of colorectal cancer.      Latest Ref Rng & Units 02/18/2022    8:45 AM 02/16/2021    8:45 AM  02/15/2020   10:52 AM  CBC  WBC 4.0 - 10.5 K/uL 6.5  5.8  6.4   Hemoglobin 13.0 - 17.0 g/dL 84.3  85.0  85.2   Hematocrit 39.0 - 52.0 % 47.0  45.8  44.3   Platelets 150.0 - 400.0 K/uL 224.0  259.0  209.0         Latest Ref Rng & Units 10/18/2022    9:08 AM 02/18/2022    8:45 AM 02/16/2021    8:45 AM  CMP  Glucose 70 - 99 mg/dL 84  85  87   BUN 6 - 23 mg/dL 11  14  13    Creatinine 0.40 - 1.50 mg/dL 9.20  9.22  9.18   Sodium 135 - 145 mEq/L 138  133  136   Potassium 3.5 - 5.1 mEq/L 4.5  4.2  4.2   Chloride 96 - 112 mEq/L 100  96  96   CO2 19 - 32 mEq/L 29  29  32   Calcium 8.4 - 10.5 mg/dL 9.2  9.5  9.7   Total Protein 6.0 - 8.3 g/dL  7.7  7.9   Total Bilirubin 0.2 - 1.2 mg/dL  0.6  0.6   Alkaline Phos 39 - 117 U/L  55  64   AST 0 - 37 U/L  30  34   ALT 0 - 53 U/L  30  41      Colonoscopy 01/17/2016 - Two 3 to 5 mm polyps in the transverse colon, removed with a cold snare. Resected and  retrieved.  - Diverticulosis in the sigmoid colon and in the transverse colon.  - Anal papilla(e) were hypertrophied. Biopsied to ensure no adenomatous change.  - Non-bleeding internal hemorrhoids. - The examination was otherwise normal  1. Surgical [P], transverse, polyp (2) - BENIGN POLYPOID COLONIC MUCOSA. - NO ADENOMATOUS CHANGE OR MALIGNANCY IDENTIFIED. 2. Surgical [P], rectum - FRAGMENTED PAPILLARY SQUAMOUS MUCOSA WITH KERATOSIS, HYPERGRANULOSIS AND KOILOCYTIC CHANGES SUGGESTIVE OF CONDYLOMA ACUMINATA. - BACKGROUND RARE FRAGMENTS OF BENIGN RECTAL-TYPE GLANDULAR MUCOSA AND MUCOID MATERIAL. - NO HIGH GRADE DYSPLASIA OR MALIGNANCY IDENTIFIED. Post colonoscopy recommendations per Dr. Leigh: - 2 benign polyps removed from his colon, non-precancerous  - He had hypertrophied anal papillae, with an attached polypoid growth to one of them which was biopsied. 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 This warrants an evaluation by surgery to discuss removal  with the patient, especially if it is causing any symptoms. Can you please let him know and coordinate an appointment surgery, I am requesting with Dr. Marcey Mendez  - timing of his recall pending surgical evaluation.   Past Medical History:  Diagnosis Date   Allergy    Arthritis    CTS (carpal tunnel syndrome) 10/21/2014   Diverticulosis 12/31/2016   Gastro-esophageal reflux 11/06/2014   pt denies   Hx of colonic polyp 12/31/2016   Muscle cramps 12/31/2016   Neck pain 10/21/2014   Past Surgical History:  Procedure Laterality Date   APPENDECTOMY     done as a teenager   HERNIA REPAIR     umbilical   TYMPANOSTOMY TUBE PLACEMENT Bilateral    childhood   Social History: He is married.  He has 1 son.  He is an Education officer, environmental.  He drinks 2 beers daily, occasionally drinks 5 beers. Past smoker, he quit smoking cigarettes 9 years ago. No drug use.   Family History: Family history includes Arthritis in his father and mother; COPD in his maternal grandfather; Cholelithiasis in his mother; Diabetes in his father; Heart disease in his father and paternal grandmother; Hyperlipidemia in his father; Hypertension in his father and mother; Multiple sclerosis in his paternal grandfather; Thyroid  disease in his brother. No known family history of esophageal, gastric or colon cancer.   No Known Allergies    Outpatient Encounter Medications as of 10/28/2023  Medication Sig   fluorouracil  (EFUDEX ) 5 % cream Apply topically 2 (two) times daily. (Patient not taking: Reported on 08/07/2023)   fluticasone  (FLONASE ) 50 MCG/ACT nasal spray Place 2 sprays into both nostrils daily. (Patient not taking: Reported on 08/07/2023)   lisinopril  (ZESTRIL ) 20 MG tablet TAKE 1 TABLET(20 MG) BY MOUTH DAILY   sildenafil  (REVATIO ) 20 MG tablet TAKE 3-5 TABLETS BY MOUTH 45 MINUTES PRIOR AS NEEDED. MAX OF 5 TABLETS DAILY   vitamin B-12 (CYANOCOBALAMIN) 1000 MCG tablet Take 1,000 mcg by mouth daily.   No facility-administered encounter  medications on file as of 10/28/2023.   REVIEW OF SYSTEMS:  Gen: Denies fever, sweats or chills. No weight loss.  CV: Denies chest pain, palpitations or edema. Resp: Denies cough, shortness of breath of hemoptysis.  GI: See HPI.  GU: Denies urinary burning, blood in urine, increased urinary frequency or incontinence. MS: Denies joint pain, muscles aches or weakness. Derm: Denies rash, itchiness, skin lesions or unhealing ulcers. Psych: Denies depression, anxiety, memory loss or confusion. Heme: Denies bruising, easy bleeding. Neuro:  Denies headaches, dizziness or paresthesias. Endo:  Denies any problems with DM, thyroid  or  adrenal function.  PHYSICAL EXAM: BP 118/76 (BP Location: Left Arm, Patient Position: Sitting, Cuff Size: Normal)   Pulse 73   Ht 5' 10 (1.778 m)   Wt 178 lb 2 oz (80.8 kg)   BMI 25.56 kg/m ' General:  58 year old male in no acute distress. Head: Normocephalic and atraumatic. Eyes:  Sclerae non-icteric, conjunctive pink. Ears: Normal auditory acuity. Mouth: Dentition intact. No ulcers or lesions.  Neck: Supple, no lymphadenopathy or thyromegaly.  Lungs: Clear bilaterally to auscultation without wheezes, crackles or rhonchi. Heart: Regular rate and rhythm. No murmur, rub or gallop appreciated.  Abdomen: Soft, nontender, nondistended. No masses. No hepatosplenomegaly. Normoactive bowel sounds x 4 quadrants.  Rectal: Deferred.  Musculoskeletal: Symmetrical with no gross deformities. Skin: Warm and dry. No rash or lesions on visible extremities. Extremities: No edema. Neurological: Alert oriented x 4, no focal deficits.  Psychological: Alert and cooperative. Normal mood and affect.  ASSESSMENT AND PLAN:  58 year old male with dysphagia with a component of odynophagia, started 10 years ago and occurs 3 times monthly or less.  No heartburn or abdominal pain. -EGD with possible esophageal dilatation benefits and risks discussed including risk with sedation, risk  of bleeding, perforation and infection  - Patient instructed to drink 3 sips of tap water prior to eating any food, cut food into small pieces and chew food thoroughly - Patient will contact our office if his swallowing difficulties worsen prior to his EGD date  History of colon polyps. Colonoscopy 01/17/2016 identified two 3 to 5 mm polyps removed from the transverse colon, path report showed benign polypoid colonic mucosa without adenomatous change. Recall colonoscopy recommended 01/2017, was not done.  -Colonoscopy benefits and risks discussed including risk with sedation, risk of bleeding, perforation and infection   History of anal lesion. 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 Previously referred to surgeon Dr. Elspeth Mendez for anal biopsy, however, the patient did not pursue.  -Await colonoscopy results, refer to colorectal surgeon if anal lesion persists     CC:  Berneta Dustin Sayre,*

## 2023-11-05 ENCOUNTER — Telehealth: Admitting: Family

## 2023-11-05 DIAGNOSIS — H00011 Hordeolum externum right upper eyelid: Secondary | ICD-10-CM

## 2023-11-05 MED ORDER — BACITRACIN-POLYMYXIN B 500-10000 UNIT/GM OP OINT
1.0000 | TOPICAL_OINTMENT | Freq: Three times a day (TID) | OPHTHALMIC | 0 refills | Status: DC
Start: 2023-11-05 — End: 2023-12-01

## 2023-11-05 NOTE — Progress Notes (Signed)
 Virtual Visit Consent   Dustin Mendez, you are scheduled for a virtual visit with a [facility] provider today. Just as with appointments in the office, your consent must be obtained to participate. Your consent will be active for this visit and any virtual visit you may have with one of our providers in the next 365 days. If you have a MyChart account, a copy of this consent can be sent to you electronically.  As this is a virtual visit, video technology does not allow for your provider to perform a traditional examination. This may limit your provider's ability to fully assess your condition. If your provider identifies any concerns that need to be evaluated in person or the need to arrange testing (such as labs, EKG, etc.), we will make arrangements to do so. Although advances in technology are sophisticated, we cannot ensure that it will always work on either your end or our end. If the connection with a video visit is poor, the visit may have to be switched to a telephone visit. With either a video or telephone visit, we are not always able to ensure that we have a secure connection.  By engaging in this virtual visit, you consent to the provision of healthcare and authorize for your insurance to be billed (if applicable) for the services provided during this visit. Depending on your insurance coverage, you may receive a charge related to this service.  I need to obtain your verbal consent now. Are you willing to proceed with your visit today? Dustin Mendez has provided verbal consent on 11/05/2023 for a virtual visit (video or telephone). Dustin Learn, FNP  Date: 11/05/2023 5:21 PM   Virtual Visit via Video Note   I, Dustin Mendez, connected with  Dustin Mendez  ([id number], 1966-04-29) on 11/05/23 at  6:30 PM EDT by a video-enabled telemedicine application and verified that I am speaking with the correct person using two identifiers.  Location: Patient: Virtual Visit Location Patient:  Home Provider: Virtual Visit Location Provider: Home Office   I discussed the limitations of evaluation and management by telemedicine and the availability of in person appointments. The patient expressed understanding and agreed to proceed.    History of Present Illness: Dustin Mendez is a 58 y.o. who identifies as a male who was assigned male at birth, and is being seen today for stye right upper eyelid that he noticed yesterday. He has not used anything, but states it has worse. Reports mild aching pain of 4 out 10. Denies any drainage.   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   Chronic left shoulder pain 02/16/2021   Need for influenza vaccination 02/15/2020   Dehydration 09/26/2017   Light headed 09/26/2017   Seasonal allergic rhinitis due to pollen 09/26/2017   Muscle cramps 12/31/2016   Hx of colonic polyp 12/31/2016   Diverticulosis 12/31/2016   Healthcare maintenance 11/06/2014   Gastro-esophageal reflux 11/06/2014   CTS (carpal tunnel syndrome) 10/21/2014   Neck pain 10/21/2014   Allergy    BACK PAIN, THORACIC REGION 10/20/2007    Allergies: No Known Allergies Medications:  Current Outpatient Medications:    bacitracin -polymyxin b  (POLYSPORIN ) ophthalmic ointment, Place 1 Application into the right eye 3 (three) times daily., Disp: 3.5 g, Rfl:  0   lisinopril  (ZESTRIL ) 20 MG tablet, TAKE 1 TABLET(20 MG) BY MOUTH DAILY, Disp: 90 tablet, Rfl: 0   sildenafil  (REVATIO ) 20 MG tablet, TAKE 3-5 TABLETS BY MOUTH 45 MINUTES PRIOR AS NEEDED. MAX OF 5 TABLETS DAILY, Disp: 50 tablet, Rfl: 2  Observations/Objective: Patient is well-developed, well-nourished in no acute distress.  Resting comfortably  at home.  Head is normocephalic, atraumatic.  No labored  breathing.  Speech is clear and coherent with logical content.  Patient is alert and oriented at baseline.  Right upper eye lid erythemas and swollen  Assessment and Plan: 1. Hordeolum externum of right upper eyelid (Primary) - bacitracin -polymyxin b  (POLYSPORIN ) ophthalmic ointment; Place 1 Application into the right eye 3 (three) times daily.  Dispense: 3.5 g; Refill: 0  Warm hot compresses  Wash hands  Apply polysporin   Avoid squeezing Follow up if symptoms worsen or do not improve   Follow Up Instructions: I discussed the assessment and treatment plan with the patient. The patient was provided an opportunity to ask questions and all were answered. The patient agreed with the plan and demonstrated an understanding of the instructions.  A copy of instructions were sent to the patient via MyChart unless otherwise noted below.     The patient was advised to call back or seek an in-person evaluation if the symptoms worsen or if the condition fails to improve as anticipated.    Dustin Learn, FNP

## 2023-12-01 ENCOUNTER — Encounter: Payer: Self-pay | Admitting: Family Medicine

## 2023-12-01 ENCOUNTER — Ambulatory Visit (INDEPENDENT_AMBULATORY_CARE_PROVIDER_SITE_OTHER): Admitting: Family Medicine

## 2023-12-01 VITALS — BP 122/76 | HR 75 | Temp 96.7°F | Ht 70.0 in | Wt 183.8 lb

## 2023-12-01 DIAGNOSIS — I10 Essential (primary) hypertension: Secondary | ICD-10-CM | POA: Diagnosis not present

## 2023-12-01 DIAGNOSIS — D229 Melanocytic nevi, unspecified: Secondary | ICD-10-CM

## 2023-12-01 DIAGNOSIS — Z131 Encounter for screening for diabetes mellitus: Secondary | ICD-10-CM | POA: Diagnosis not present

## 2023-12-01 DIAGNOSIS — Z125 Encounter for screening for malignant neoplasm of prostate: Secondary | ICD-10-CM | POA: Diagnosis not present

## 2023-12-01 DIAGNOSIS — Z13220 Encounter for screening for lipoid disorders: Secondary | ICD-10-CM | POA: Diagnosis not present

## 2023-12-01 DIAGNOSIS — Z0000 Encounter for general adult medical examination without abnormal findings: Secondary | ICD-10-CM

## 2023-12-01 LAB — CBC WITH DIFFERENTIAL/PLATELET
Basophils Absolute: 0.1 K/uL (ref 0.0–0.1)
Basophils Relative: 1 % (ref 0.0–3.0)
Eosinophils Absolute: 0.1 K/uL (ref 0.0–0.7)
Eosinophils Relative: 1.1 % (ref 0.0–5.0)
HCT: 43.5 % (ref 39.0–52.0)
Hemoglobin: 14.7 g/dL (ref 13.0–17.0)
Lymphocytes Relative: 25.2 % (ref 12.0–46.0)
Lymphs Abs: 1.7 K/uL (ref 0.7–4.0)
MCHC: 33.8 g/dL (ref 30.0–36.0)
MCV: 88.6 fl (ref 78.0–100.0)
Monocytes Absolute: 0.7 K/uL (ref 0.1–1.0)
Monocytes Relative: 9.8 % (ref 3.0–12.0)
Neutro Abs: 4.3 K/uL (ref 1.4–7.7)
Neutrophils Relative %: 62.9 % (ref 43.0–77.0)
Platelets: 233 K/uL (ref 150.0–400.0)
RBC: 4.91 Mil/uL (ref 4.22–5.81)
RDW: 13.3 % (ref 11.5–15.5)
WBC: 6.8 K/uL (ref 4.0–10.5)

## 2023-12-01 LAB — URINALYSIS, ROUTINE W REFLEX MICROSCOPIC
Bilirubin Urine: NEGATIVE
Hgb urine dipstick: NEGATIVE
Ketones, ur: NEGATIVE
Leukocytes,Ua: NEGATIVE
Nitrite: NEGATIVE
RBC / HPF: NONE SEEN (ref 0–?)
Specific Gravity, Urine: <=1.005 — AB (ref 1.000–1.030)
Total Protein, Urine: NEGATIVE
Urine Glucose: NEGATIVE
Urobilinogen, UA: 0.2 (ref 0.0–1.0)
WBC, UA: NONE SEEN (ref 0–?)
pH: 6 (ref 5.0–8.0)

## 2023-12-01 LAB — COMPREHENSIVE METABOLIC PANEL WITH GFR
ALT: 43 U/L (ref 0–53)
AST: 30 U/L (ref 0–37)
Albumin: 4.4 g/dL (ref 3.5–5.2)
Alkaline Phosphatase: 57 U/L (ref 39–117)
BUN: 11 mg/dL (ref 6–23)
CO2: 29 meq/L (ref 19–32)
Calcium: 9.1 mg/dL (ref 8.4–10.5)
Chloride: 95 meq/L — ABNORMAL LOW (ref 96–112)
Creatinine, Ser: 0.76 mg/dL (ref 0.40–1.50)
GFR: 99.43 mL/min (ref >60.00)
Glucose, Bld: 95 mg/dL (ref 70–99)
Potassium: 4.2 meq/L (ref 3.5–5.1)
Sodium: 131 meq/L — ABNORMAL LOW (ref 135–145)
Total Bilirubin: 0.7 mg/dL (ref 0.2–1.2)
Total Protein: 7.3 g/dL (ref 6.0–8.3)

## 2023-12-01 LAB — LIPID PANEL
Cholesterol: 180 mg/dL (ref 0–200)
HDL: 74.9 mg/dL (ref >39.00)
LDL Cholesterol: 82 mg/dL (ref 0–99)
NonHDL: 105.49
Total CHOL/HDL Ratio: 2
Triglycerides: 116 mg/dL (ref 0.0–149.0)
VLDL: 23.2 mg/dL (ref 0.0–40.0)

## 2023-12-01 LAB — HEMOGLOBIN A1C: Hgb A1c MFr Bld: 5.9 % (ref 4.6–6.5)

## 2023-12-01 LAB — PSA: PSA: 1.72 ng/mL (ref 0.10–4.00)

## 2023-12-01 MED ORDER — LISINOPRIL 20 MG PO TABS: 20.0000 mg | ORAL_TABLET | Freq: Every day | ORAL | 3 refills | Status: AC

## 2023-12-01 NOTE — Progress Notes (Signed)
 Subjective:  Patient ID: Dustin Mendez, male    DOB: 04/01/1966  Age: 58 y.o. MRN: [id number]  Chief Complaint  Patient presents with   Annual Exam    CPE. Pt is fasting. Refused Prevnar.     HPI Encounter Diagnoses  Name Primary?   Healthcare maintenance Yes   Essential hypertension    Screening for prostate cancer    Screening for cholesterol level    Screening for diabetes mellitus    Atypical nevus    Here for physical and follow-up of above.  Doing well.  Continues to work.  Has regular dental care.  No regular exercise.  Continues lisinopril  20 for blood pressure.   Review of Systems  Constitutional: Negative.   HENT: Negative.    Eyes:  Negative for blurred vision, discharge and redness.  Respiratory: Negative.    Cardiovascular: Negative.   Gastrointestinal:  Negative for abdominal pain.  Genitourinary: Negative.   Musculoskeletal: Negative.  Negative for myalgias.  Skin:  Negative for rash.  Neurological:  Negative for tingling, loss of consciousness and weakness.  Endo/Heme/Allergies:  Negative for polydipsia.     Current Outpatient Medications:    sildenafil  (REVATIO ) 20 MG tablet, TAKE 3-5 TABLETS BY MOUTH 45 MINUTES PRIOR AS NEEDED. MAX OF 5 TABLETS DAILY, Disp: 50 tablet, Rfl: 2   lisinopril  (ZESTRIL ) 20 MG tablet, Take 1 tablet (20 mg total) by mouth daily., Disp: 90 tablet, Rfl: 3   Objective:     BP 122/76 (BP Location: Right Arm, Patient Position: Sitting)   Pulse 75   Temp (!) 96.7 F (35.9 C) (Temporal)   Ht 5' 10 (1.778 m)   Wt 183 lb 12.8 oz (83.4 kg)   SpO2 96%   BMI 26.37 kg/m  BP Readings from Last 3 Encounters:  12/01/23 122/76  10/28/23 118/76  08/07/23 134/80   Wt Readings from Last 3 Encounters:  12/01/23 183 lb 12.8 oz (83.4 kg)  10/28/23 178 lb 2 oz (80.8 kg)  08/07/23 180 lb (81.6 kg)      Physical Exam Constitutional:      General: He is not in acute distress.    Appearance: Normal appearance. He is not  ill-appearing, toxic-appearing or diaphoretic.  HENT:     Head: Normocephalic and atraumatic.     Right Ear: External ear normal.     Left Ear: External ear normal.     Mouth/Throat:     Mouth: Mucous membranes are moist.     Pharynx: Oropharynx is clear. No oropharyngeal exudate or posterior oropharyngeal erythema.  Eyes:     General: No scleral icterus.       Right eye: No discharge.        Left eye: No discharge.     Extraocular Movements: Extraocular movements intact.     Conjunctiva/sclera: Conjunctivae normal.     Pupils: Pupils are equal, round, and reactive to light.  Cardiovascular:     Rate and Rhythm: Normal rate and regular rhythm.  Pulmonary:     Effort: Pulmonary effort is normal. No respiratory distress.     Breath sounds: Normal breath sounds.  Abdominal:     General: Bowel sounds are normal.     Tenderness: There is no abdominal tenderness. There is no guarding.  Genitourinary:    Comments: Patient declined exam today. Musculoskeletal:     Cervical back: No rigidity or tenderness.  Skin:    General: Skin is warm and dry.  Neurological:     Mental Status:  He is alert and oriented to person, place, and time.  Psychiatric:        Mood and Affect: Mood normal.        Behavior: Behavior normal.   Immedia oka she has bee No results found for any visits on 12/01/23.    The 10-year ASCVD risk score (Arnett DK, et al., 2019) is: 4.9%    Assessment & Plan:   Healthcare maintenance  Essential hypertension -     CBC with Differential/Platelet -     Comprehensive metabolic panel with GFR -     Urinalysis, Routine w reflex microscopic -     Lisinopril ; Take 1 tablet (20 mg total) by mouth daily.  Dispense: 90 tablet; Refill: 3  Screening for prostate cancer -     PSA  Screening for cholesterol level -     Comprehensive metabolic panel with GFR -     Lipid panel  Screening for diabetes mellitus -     Comprehensive metabolic panel with GFR -      Hemoglobin A1c  Atypical nevus -     Ambulatory referral to Dermatology    Return in about 1 year (around 11/30/2024), or if symptoms worsen or fail to improve.  Information was given on health maintenance and disease prevention.  Information was also given on preventing type 2 diabetes.  Referral for a mole checkup.  Patient declines Prevnar 20 vaccine.  Discussed its importance.  Elsie Sim Lent, MD

## 2023-12-02 ENCOUNTER — Ambulatory Visit: Payer: Self-pay | Admitting: Family Medicine

## 2023-12-04 ENCOUNTER — Encounter: Payer: Self-pay | Admitting: Gastroenterology

## 2023-12-07 ENCOUNTER — Encounter: Payer: Self-pay | Admitting: Gastroenterology

## 2023-12-12 ENCOUNTER — Ambulatory Visit: Admitting: Gastroenterology

## 2023-12-12 ENCOUNTER — Encounter: Payer: Self-pay | Admitting: Gastroenterology

## 2023-12-12 VITALS — BP 135/85 | HR 64 | Temp 98.1°F | Resp 10 | Ht 70.0 in | Wt 178.0 lb

## 2023-12-12 DIAGNOSIS — K222 Esophageal obstruction: Secondary | ICD-10-CM

## 2023-12-12 DIAGNOSIS — K5730 Diverticulosis of large intestine without perforation or abscess without bleeding: Secondary | ICD-10-CM | POA: Diagnosis not present

## 2023-12-12 DIAGNOSIS — K2289 Other specified disease of esophagus: Secondary | ICD-10-CM

## 2023-12-12 DIAGNOSIS — D128 Benign neoplasm of rectum: Secondary | ICD-10-CM

## 2023-12-12 DIAGNOSIS — K648 Other hemorrhoids: Secondary | ICD-10-CM

## 2023-12-12 DIAGNOSIS — K6289 Other specified diseases of anus and rectum: Secondary | ICD-10-CM | POA: Diagnosis not present

## 2023-12-12 DIAGNOSIS — K2100 Gastro-esophageal reflux disease with esophagitis, without bleeding: Secondary | ICD-10-CM | POA: Diagnosis not present

## 2023-12-12 DIAGNOSIS — K644 Residual hemorrhoidal skin tags: Secondary | ICD-10-CM

## 2023-12-12 DIAGNOSIS — Z1211 Encounter for screening for malignant neoplasm of colon: Secondary | ICD-10-CM | POA: Diagnosis present

## 2023-12-12 DIAGNOSIS — K621 Rectal polyp: Secondary | ICD-10-CM

## 2023-12-12 DIAGNOSIS — Z860100 Personal history of colon polyps, unspecified: Secondary | ICD-10-CM

## 2023-12-12 DIAGNOSIS — D123 Benign neoplasm of transverse colon: Secondary | ICD-10-CM | POA: Diagnosis not present

## 2023-12-12 DIAGNOSIS — R1310 Dysphagia, unspecified: Secondary | ICD-10-CM

## 2023-12-12 MED ORDER — SODIUM CHLORIDE 0.9 % IV SOLN: 500.0000 mL | INTRAVENOUS | Status: DC

## 2023-12-12 MED ORDER — OMEPRAZOLE 40 MG PO CPDR
40.0000 mg | DELAYED_RELEASE_CAPSULE | Freq: Every day | ORAL | 3 refills | Status: AC
Start: 2023-12-12 — End: ?

## 2023-12-12 NOTE — Op Note (Signed)
 [facility] Patient Name: Dustin Mendez Procedure Date: 12/12/2023 12:30 PM MRN: [id number] Endoscopist: Elspeth P. Leigh , MD, [id number] Age: 58 Referring MD:  Date of Birth: 02-Aug-1965 Gender: Male Account #: [id number] Procedure:                Colonoscopy Indications:              High risk colon cancer surveillance: Personal                            history of rectal condyloma in 2017, referred for                            surgical removal but not done. Medicines:                Monitored Anesthesia Care Procedure:                Pre-Anesthesia Assessment:                           - Prior to the procedure, a History and Physical                            was performed, and patient medications and                            allergies were reviewed. The patient's tolerance of                            previous anesthesia was also reviewed. The risks                            and benefits of the procedure and the sedation                            options and risks were discussed with the patient.                            All questions were answered, and informed consent                            was obtained. Prior Anticoagulants: The patient has                            taken no anticoagulant or antiplatelet agents. ASA                            Grade Assessment: II - A patient with mild systemic                            disease. After reviewing the risks and benefits,                            the patient was deemed in satisfactory condition to  undergo the procedure.                           After obtaining informed consent, the colonoscope                            was passed under direct vision. Throughout the                            procedure, the patient's blood pressure, pulse, and                            oxygen saturations were monitored continuously. The                            CF [id number] #[id number] was  introduced through the anus                            and advanced to the the cecum, identified by                            appendiceal orifice and ileocecal valve. The                            colonoscopy was performed without difficulty. The                            patient tolerated the procedure well. The quality                            of the bowel preparation was good. The ileocecal                            valve, appendiceal orifice, and rectum were                            photographed. Scope In: 1:34:21 PM Scope Out: 1:55:18 PM Scope Withdrawal Time: 0 hours 15 minutes 50 seconds  Total Procedure Duration: 0 hours 20 minutes 57 seconds  Findings:                 Skin tags were found on perianal exam.                           A 3 mm polyp was found in the transverse colon. The                            polyp was sessile. The polyp was removed with a                            cold snare. Resection and retrieval were complete.                           A few small-mouthed diverticula were found in the  sigmoid colon.                           A 4 to 5 mm polyp was found in the rectum. The                            polyp was sessile. The polyp was removed with a                            cold snare. Resection and retrieval were complete.                           Anal papilla(e) were hypertrophied vs. condyloma.                            Biopsies were taken with a cold forceps for                            histology - rule out condyloma, AIN, dysplasia,                            etc. No interval enlargement since when last                            evaluated.                           Internal hemorrhoids were found during retroflexion.                           The exam was otherwise without abnormality. Complications:            No immediate complications. Estimated blood loss:                            Minimal. Estimated Blood  Loss:     Estimated blood loss was minimal. Impression:               - Perianal skin tags found on perianal exam.                           - One 3 mm polyp in the transverse colon, removed                            with a cold snare. Resected and retrieved.                           - Diverticulosis in the sigmoid colon.                           - One 4 to 5 mm polyp in the rectum, removed with a                            cold snare. Resected and retrieved.                           -  Anal papilla(e) were hypertrophied vs. condyloma.                            Biopsied.                           - Internal hemorrhoids.                           - The examination was otherwise normal. Recommendation:           - Patient has a contact number available for                            emergencies. The signs and symptoms of potential                            delayed complications were discussed with the                            patient. Return to normal activities tomorrow.                            Written discharge instructions were provided to the                            patient.                           - Resume previous diet.                           - Continue present medications.                           - Await pathology results. Elspeth P. Raegan Winders, MD 12/12/2023 2:05:10 PM This report has been signed electronically.

## 2023-12-12 NOTE — Progress Notes (Signed)
 [facility] History and Physical   Primary Care Physician:  Berneta Elsie Sayre, MD   Reason for Procedure:   Dysphagia, history of colon polyps  Plan:    EGD and colonoscopy     HPI: MONTRAE BRAITHWAITE is a 58 y.o. male  here for EGD and colonoscopy - EGD to evaluate dysphagia. Colonoscopy showed anal condyloma in 2018, recommended surgical resection, which he did not have done. Colonoscopy to re-evaluate this area.   Patient denies any bowel symptoms at this time. No family history of colon cancer known. Otherwise feels well without any cardiopulmonary symptoms.   I have discussed risks / benefits of anesthesia and endoscopic procedure with Aayush V Bennis and they wish to proceed with the exams as outlined today.    Past Medical History:  Diagnosis Date   Allergy    Arthritis    CTS (carpal tunnel syndrome) 10/21/2014   Diverticulosis 12/31/2016   Gastro-esophageal reflux 11/06/2014   pt denies   HTN (hypertension)    Hx of colonic polyp 12/31/2016   Muscle cramps 12/31/2016   Neck pain 10/21/2014    Past Surgical History:  Procedure Laterality Date   APPENDECTOMY     done as a teenager   HERNIA REPAIR     umbilical   TYMPANOSTOMY TUBE PLACEMENT Bilateral    childhood    Prior to Admission medications   Medication Sig Start Date End Date Taking? Authorizing Provider  lisinopril  (ZESTRIL ) 20 MG tablet Take 1 tablet (20 mg total) by mouth daily. 12/01/23  Yes Berneta Elsie Sayre, MD  sildenafil  (REVATIO ) 20 MG tablet TAKE 3-5 TABLETS BY MOUTH 45 MINUTES PRIOR AS NEEDED. MAX OF 5 TABLETS DAILY 10/20/23  Yes Berneta Elsie Sayre, MD    Current Outpatient Medications  Medication Sig Dispense Refill   lisinopril  (ZESTRIL ) 20 MG tablet Take 1 tablet (20 mg total) by mouth daily. 90 tablet 3   sildenafil  (REVATIO ) 20 MG tablet TAKE 3-5 TABLETS BY MOUTH 45 MINUTES PRIOR AS NEEDED. MAX OF 5 TABLETS DAILY 50 tablet 2   Current Facility-Administered Medications   Medication Dose Route Frequency Provider Last Rate Last Admin   0.9 %  sodium chloride  infusion  500 mL Intravenous Continuous Adrick Kestler, Elspeth SQUIBB, MD        Allergies as of 12/12/2023   (No Known Allergies)    Family History  Problem Relation Age of Onset   Arthritis Mother    Hypertension Mother    Cholelithiasis Mother    Arthritis Father    Hypertension Father    Heart disease Father        arrythmia s/p ablation   Hyperlipidemia Father    Diabetes Father    Thyroid  disease Brother    COPD Maternal Grandfather        silicosis? work exposure and a smoker   Heart disease Paternal Grandmother        arrythmia dies while placing pacer   Multiple sclerosis Paternal Grandfather    Colon cancer Neg Hx     Social History   Socioeconomic History   Marital status: Married    Spouse name: Not on file   Number of children: 1   Years of education: 14   Highest education level: Not on file  Occupational History   Occupation: Journalist, newspaper  Tobacco Use   Smoking status: Former    Current packs/day: 0.00    Types: Cigarettes    Quit date: 02/19/2014    Years since quitting:  9.8   Smokeless tobacco: Former    Types: Chew    Quit date: 02/01/2014  Vaping Use   Vaping status: Every Day  Substance and Sexual Activity   Alcohol use: Yes    Alcohol/week: 7.0 standard drinks of alcohol    Types: 7 Cans of beer per week    Comment: beer    Drug use: No   Sexual activity: Yes    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  Physical Exam: Vital signs BP (!) 153/96   Pulse 71   Temp 98.1 F (36.7 C) (Temporal)   Resp 18   Ht 5' 10 (1.778 m)   Wt 178 lb (80.7 kg)   SpO2 96%   BMI 25.54 kg/m   General:   Alert,  Well-developed, pleasant and cooperative in NAD Lungs:  Clear throughout to auscultation.   Heart:  Regular rate and rhythm Abdomen:  Soft, nontender and nondistended.   Neuro/Psych:  Alert and cooperative. Normal mood and affect. A and O x 3  Marcey Naval, MD [facility]

## 2023-12-12 NOTE — Op Note (Addendum)
  [facility] Patient Name: Dustin Mendez Procedure Date: 12/12/2023 12:41 PM MRN: [id number] Endoscopist: Elspeth P. Leigh , MD, [id number] Age: 58 Referring MD:  Date of Birth: 10/06/1965 Gender: Male Account #: [id number] Procedure:                Upper GI endoscopy Indications:              Dysphagia Medicines:                Monitored Anesthesia Care Procedure:                Pre-Anesthesia Assessment:                           - Prior to the procedure, a History and Physical                            was performed, and patient medications and                            allergies were reviewed. The patient's tolerance of                            previous anesthesia was also reviewed. The risks                            and benefits of the procedure and the sedation                            options and risks were discussed with the patient.                            All questions were answered, and informed consent                            was obtained. Prior Anticoagulants: The patient has                            taken no anticoagulant or antiplatelet agents. ASA                            Grade Assessment: II - A patient with mild systemic                            disease. After reviewing the risks and benefits,                            the patient was deemed in satisfactory condition to                            undergo the procedure.                           After obtaining informed consent, the endoscope was  passed under direct vision. Throughout the                            procedure, the patient's blood pressure, pulse, and                            oxygen saturations were monitored continuously. The                            Olympus Scope [id number] was introduced through the                            mouth, and advanced to the second part of duodenum.                            The upper GI endoscopy was accomplished  without                            difficulty. The patient tolerated the procedure                            well. Scope In: Scope Out: Findings:                 Esophagogastric landmarks were identified: the                            Z-line was found at 40 cm, the gastroesophageal                            junction was found at 40 cm and the upper extent of                            the gastric folds was found at 40 cm from the                            incisors.                           One benign-appearing, severe intrinsic stenosis was                            found at the GEJ. This stenosis measured less than                            one cm (in length). The stenosis was traversed with                            the upper endoscope which itself led to appropriate                            dilation effect. Once this was noted, no further  dilation performed.                           Esophagitis was found at the GEJ.                           There was a 5-72mm plaque were found in the middle                            third of the esophagus, 25 cm from the incisors.                            Biopsies were taken with a cold forceps for                            histology.                           There was some slightly furrowing of the esophagus.                            The exam of the esophagus was otherwise normal.                           Biopsies were obtained from the proximal and distal                            esophagus with cold forceps for histology of                            suspected eosinophilic esophagitis.                           The entire examined stomach was normal.                           The examined duodenum was normal. Complications:            No immediate complications. Estimated blood loss:                            Minimal. Estimated Blood Loss:     Estimated blood loss was minimal. Impression:                - Esophagogastric landmarks identified.                           - Benign-appearing esophageal stenosis, dilated                            with the upper endoscope.                           - Reflux esophagitis.                           - Plaque in the middle third  of the esophagus.                            Biopsied.                           - Mild furrowing noted - biopsies taken to rule out                            EoE.                           - Normal stomach.                           - Normal examined duodenum. Recommendation:           - Patient has a contact number available for                            emergencies. The signs and symptoms of potential                            delayed complications were discussed with the                            patient. Return to normal activities tomorrow.                            Written discharge instructions were provided to the                            patient.                           - Post dilation diet.                           - Continue present medications.                           - Post dilation diet, stay on soft diet / avoid                            meat / chicken until additional dilation can be                            performed                           - Await pathology results.                           - Start omeprazole  40mg  / day for now                           - Repeat EGD in 2-4 weeks for repeat dilation to  open stricture further. Symptoms I suspect will be                            improved by this exam but not resolved, needs                            further dilation to larger diameter to resolve                            dysphagia. Elspeth P. Dola Lunsford, MD 12/12/2023 2:10:49 PM This report has been signed electronically.

## 2023-12-12 NOTE — Progress Notes (Signed)
 Called to room to assist during endoscopic procedure.  Patient ID and intended procedure confirmed with present staff. Received instructions for my participation in the procedure from the performing physician.

## 2023-12-12 NOTE — Patient Instructions (Addendum)
 Handouts given on esophagitis and stricture, polyps, and diverticulosis.  Follow post dilation diet.  Prescription for omeprazole  sent to pharmacy on file.    YOU HAD AN ENDOSCOPIC PROCEDURE TODAY AT THE [facility]:   Refer to the procedure report that was given to you for any specific questions about what was found during the examination.  If the procedure report does not answer your questions, please call your gastroenterologist to clarify.  If you requested that your care partner not be given the details of your procedure findings, then the procedure report has been included in a sealed envelope for you to review at your convenience later.  YOU SHOULD EXPECT: Some feelings of bloating in the abdomen. Passage of more gas than usual.  Walking can help get rid of the air that was put into your GI tract during the procedure and reduce the bloating. If you had a lower endoscopy (such as a colonoscopy or flexible sigmoidoscopy) you may notice spotting of blood in your stool or on the toilet paper. If you underwent a bowel prep for your procedure, you may not have a normal bowel movement for a few days.  Please Note:  You might notice some irritation and congestion in your nose or some drainage.  This is from the oxygen used during your procedure.  There is no need for concern and it should clear up in a day or so.  SYMPTOMS TO REPORT IMMEDIATELY:  Following lower endoscopy (colonoscopy or flexible sigmoidoscopy):  Excessive amounts of blood in the stool  Significant tenderness or worsening of abdominal pains  Swelling of the abdomen that is new, acute  Fever of 100F or higher  Following upper endoscopy (EGD)  Vomiting of blood or coffee ground material  New chest pain or pain under the shoulder blades  Painful or persistently difficult swallowing  New shortness of breath  Fever of 100F or higher  Black, tarry-looking stools  For urgent or emergent issues, a gastroenterologist  can be reached at any hour by calling [phone]. Do not use MyChart messaging for urgent concerns.    DIET:  We do recommend a small meal at first, but then you may proceed to your regular diet.  Drink plenty of fluids but you should avoid alcoholic beverages for 24 hours.  ACTIVITY:  You should plan to take it easy for the rest of today and you should NOT DRIVE or use heavy machinery until tomorrow (because of the sedation medicines used during the test).    FOLLOW UP: Our staff will call the number listed on your records the next business day following your procedure.  We will call around 7:15- 8:00 am to check on you and address any questions or concerns that you may have regarding the information given to you following your procedure. If we do not reach you, we will leave a message.     If any biopsies were taken you will be contacted by phone or by letter within the next 1-3 weeks.  Please call us  at [phone] if you have not heard about the biopsies in 3 weeks.    SIGNATURES/CONFIDENTIALITY: You and/or your care partner have signed paperwork which will be entered into your electronic medical record.  These signatures attest to the fact that that the information above on your After Visit Summary has been reviewed and is understood.  Full responsibility of the confidentiality of this discharge information lies with you and/or your care-partner.

## 2023-12-12 NOTE — Progress Notes (Signed)
 1313 Robinul 0.1 mg IV given due large amount of secretions upon assessment.  MD made aware, vss

## 2023-12-12 NOTE — Progress Notes (Signed)
 Data will not transfer from monitor, vs being posted manually.

## 2023-12-12 NOTE — Progress Notes (Signed)
 Pt's states no medical or surgical changes since previsit or office visit.

## 2023-12-15 ENCOUNTER — Telehealth: Payer: Self-pay

## 2023-12-15 NOTE — Telephone Encounter (Signed)
 Follow up call to pt, no answer.

## 2023-12-18 ENCOUNTER — Ambulatory Visit: Payer: Self-pay | Admitting: Gastroenterology

## 2023-12-18 LAB — SURGICAL PATHOLOGY

## 2024-01-01 ENCOUNTER — Other Ambulatory Visit: Payer: Self-pay

## 2024-01-01 ENCOUNTER — Telehealth: Payer: Self-pay | Admitting: Nurse Practitioner

## 2024-01-01 NOTE — Telephone Encounter (Signed)
 PT has an EGD scheduled for 9/9 and needs to have instructions sent to MyChart. Please advise.

## 2024-01-01 NOTE — Telephone Encounter (Signed)
 Pt sent egd instructions via mychart as requested.

## 2024-01-05 ENCOUNTER — Telehealth: Payer: Self-pay

## 2024-01-05 NOTE — Telephone Encounter (Signed)
 The reason for egd is esophageal stricture.

## 2024-01-06 ENCOUNTER — Telehealth: Payer: Self-pay | Admitting: *Deleted

## 2024-01-06 ENCOUNTER — Ambulatory Visit: Admitting: Gastroenterology

## 2024-01-06 ENCOUNTER — Encounter: Payer: Self-pay | Admitting: Gastroenterology

## 2024-01-06 VITALS — BP 108/79 | HR 67 | Temp 98.0°F | Resp 15 | Ht 70.0 in | Wt 178.2 lb

## 2024-01-06 DIAGNOSIS — K2270 Barrett's esophagus without dysplasia: Secondary | ICD-10-CM

## 2024-01-06 DIAGNOSIS — K222 Esophageal obstruction: Secondary | ICD-10-CM

## 2024-01-06 DIAGNOSIS — R1310 Dysphagia, unspecified: Secondary | ICD-10-CM

## 2024-01-06 MED ORDER — SODIUM CHLORIDE 0.9 % IV SOLN: 500.0000 mL | Freq: Once | INTRAVENOUS | Status: DC

## 2024-01-06 NOTE — Patient Instructions (Signed)
 Please read handouts provided. Continue present medications. Dilation Diet. Continue omeprazole  for now. Await pathology results.   YOU HAD AN ENDOSCOPIC PROCEDURE TODAY AT THE [facility]:   Refer to the procedure report that was given to you for any specific questions about what was found during the examination.  If the procedure report does not answer your questions, please call your gastroenterologist to clarify.  If you requested that your care partner not be given the details of your procedure findings, then the procedure report has been included in a sealed envelope for you to review at your convenience later.  YOU SHOULD EXPECT: Some feelings of bloating in the abdomen. Passage of more gas than usual.  Walking can help get rid of the air that was put into your GI tract during the procedure and reduce the bloating. If you had a lower endoscopy (such as a colonoscopy or flexible sigmoidoscopy) you may notice spotting of blood in your stool or on the toilet paper. If you underwent a bowel prep for your procedure, you may not have a normal bowel movement for a few days.  Please Note:  You might notice some irritation and congestion in your nose or some drainage.  This is from the oxygen used during your procedure.  There is no need for concern and it should clear up in a day or so.  SYMPTOMS TO REPORT IMMEDIATELY:  Following upper endoscopy (EGD)  Vomiting of blood or coffee ground material  New chest pain or pain under the shoulder blades  Painful or persistently difficult swallowing  New shortness of breath  Fever of 100F or higher  Black, tarry-looking stools  For urgent or emergent issues, a gastroenterologist can be reached at any hour by calling [phone]. Do not use MyChart messaging for urgent concerns.    DIET:   Drink plenty of fluids but you should avoid alcoholic beverages for 24 hours.  ACTIVITY:  You should plan to take it easy for the rest of today  and you should NOT DRIVE or use heavy machinery until tomorrow (because of the sedation medicines used during the test).    FOLLOW UP: Our staff will call the number listed on your records the next business day following your procedure.  We will call around 7:15- 8:00 am to check on you and address any questions or concerns that you may have regarding the information given to you following your procedure. If we do not reach you, we will leave a message.     If any biopsies were taken you will be contacted by phone or by letter within the next 1-3 weeks.  Please call us  at [phone] if you have not heard about the biopsies in 3 weeks.    SIGNATURES/CONFIDENTIALITY: You and/or your care partner have signed paperwork which will be entered into your electronic medical record.  These signatures attest to the fact that that the information above on your After Visit Summary has been reviewed and is understood.  Full responsibility of the confidentiality of this discharge information lies with you and/or your care-partner.

## 2024-01-06 NOTE — Progress Notes (Signed)
 [facility] History and Physical   Primary Care Physician:  Berneta Elsie Sayre, MD   Reason for Procedure:   Dysphagia, esophageal stricture  Plan:    EGD with dilation     HPI: Dustin Mendez is a 58 y.o. male  here for EGD with dilation - had an exam 8/15 with severe GEJ stenosis, dilated with the scope itself. Biopsies show 11 Eos / HPF. Started on omeprazole  40mg  / day. Repeat EGD to dilate further. Last dilation has helped his dysphagia but has not resolved it.  Otherwise feels well without any cardiopulmonary symptoms.   I have discussed risks / benefits of anesthesia and endoscopic procedure with Dustin Mendez and they wish to proceed with the exams as outlined today.    Past Medical History:  Diagnosis Date   Allergy    Arthritis    CTS (carpal tunnel syndrome) 10/21/2014   Diverticulosis 12/31/2016   Gastro-esophageal reflux 11/06/2014   pt denies   HTN (hypertension)    Hx of colonic polyp 12/31/2016   Muscle cramps 12/31/2016   Neck pain 10/21/2014    Past Surgical History:  Procedure Laterality Date   APPENDECTOMY     done as a teenager   HERNIA REPAIR     umbilical   TYMPANOSTOMY TUBE PLACEMENT Bilateral    childhood    Prior to Admission medications   Medication Sig Start Date End Date Taking? Authorizing Provider  lisinopril  (ZESTRIL ) 20 MG tablet Take 1 tablet (20 mg total) by mouth daily. 12/01/23  Yes Berneta Elsie Sayre, MD  omeprazole  (PRILOSEC) 40 MG capsule Take 1 capsule (40 mg total) by mouth daily. 12/12/23  Yes Aamori Mcmasters, Elspeth SQUIBB, MD  sildenafil  (REVATIO ) 20 MG tablet TAKE 3-5 TABLETS BY MOUTH 45 MINUTES PRIOR AS NEEDED. MAX OF 5 TABLETS DAILY 10/20/23  Yes Berneta Elsie Sayre, MD    Current Outpatient Medications  Medication Sig Dispense Refill   lisinopril  (ZESTRIL ) 20 MG tablet Take 1 tablet (20 mg total) by mouth daily. 90 tablet 3   omeprazole  (PRILOSEC) 40 MG capsule Take 1 capsule (40 mg total) by mouth daily. 90  capsule 3   sildenafil  (REVATIO ) 20 MG tablet TAKE 3-5 TABLETS BY MOUTH 45 MINUTES PRIOR AS NEEDED. MAX OF 5 TABLETS DAILY 50 tablet 2   Current Facility-Administered Medications  Medication Dose Route Frequency Provider Last Rate Last Admin   0.9 %  sodium chloride  infusion  500 mL Intravenous Once Royce Stegman, Elspeth SQUIBB, MD        Allergies as of 01/06/2024   (No Known Allergies)    Family History  Problem Relation Age of Onset   Arthritis Mother    Hypertension Mother    Cholelithiasis Mother    Arthritis Father    Hypertension Father    Heart disease Father        arrythmia s/p ablation   Hyperlipidemia Father    Diabetes Father    Thyroid  disease Brother    COPD Maternal Grandfather        silicosis? work exposure and a smoker   Heart disease Paternal Grandmother        arrythmia dies while placing pacer   Multiple sclerosis Paternal Grandfather    Colon cancer Neg Hx    Esophageal cancer Neg Hx    Rectal cancer Neg Hx    Stomach cancer Neg Hx     Social History   Socioeconomic History   Marital status: Married    Spouse name: Not  on file   Number of children: 1   Years of education: 14   Highest education level: Not on file  Occupational History   Occupation: Journalist, newspaper  Tobacco Use   Smoking status: Former    Current packs/day: 0.00    Types: Cigarettes    Quit date: 02/19/2014    Years since quitting: 9.8   Smokeless tobacco: Former    Types: Chew    Quit date: 02/01/2014  Vaping Use   Vaping status: Every Day   Substances: Nicotine  Substance and Sexual Activity   Alcohol use: Yes    Alcohol/week: 7.0 standard drinks of alcohol    Types: 7 Cans of beer per week    Comment: beer    Drug use: No   Sexual activity: Yes    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  Physical Exam: Vital signs BP 139/86   Pulse 61   Temp 98 F (36.7 C) (Skin)   Ht 5' 10 (1.778 m)   Wt 178 lb 3.2 oz (80.8 kg)   SpO2 99%   BMI 25.57 kg/m   General:   Alert,  Well-developed, pleasant and cooperative in NAD Lungs:  Clear throughout to auscultation.   Heart:  Regular rate and rhythm Abdomen:  Soft, nontender and nondistended.   Neuro/Psych:  Alert and cooperative. Normal mood and affect. A and O x 3  Marcey Naval, MD [facility]

## 2024-01-06 NOTE — Progress Notes (Signed)
 To PACU via stretcher, sedated, good respiratory effort, VSS.

## 2024-01-06 NOTE — Progress Notes (Signed)
 Called to room to assist during endoscopic procedure.  Patient ID and intended procedure confirmed with present staff. Received instructions for my participation in the procedure from the performing physician.

## 2024-01-06 NOTE — Telephone Encounter (Signed)
-----   Message from Elspeth SHAUNNA Naval sent at 01/06/2024  2:57 PM EDT ----- Regarding: [facility] referral Can you help refer this patient to [facility] colorectal surgery for hypertrophied anal papillae? Thanks

## 2024-01-06 NOTE — Op Note (Signed)
 [facility] Patient Name: Dustin Mendez Procedure Date: 01/06/2024 11:58 AM MRN: [id number] Endoscopist: Elspeth P. Leigh , MD, [id number] Age: 58 Referring MD:  Date of Birth: [birth year] Gender: Male Account #: [id number] Procedure:                Upper GI endoscopy Indications:              Dysphagia, For therapy of esophageal stenosis -                            last EGD 12/12/23 - GEJ stenosis dilated with the                            scope itself with improvement but not resolution of                            dysphagia. Furrowing also noted on the last exam -                            biopsies showed 11 Eos/HPF - now on omeprazole  40mg                             / day Medicines:                Monitored Anesthesia Care Procedure:                Pre-Anesthesia Assessment:                           - Prior to the procedure, a History and Physical                            was performed, and patient medications and                            allergies were reviewed. The patient's tolerance of                            previous anesthesia was also reviewed. The risks                            and benefits of the procedure and the sedation                            options and risks were discussed with the patient.                            All questions were answered, and informed consent                            was obtained. Prior Anticoagulants: The patient has                            taken no anticoagulant or antiplatelet agents. ASA  Grade Assessment: II - A patient with mild systemic                            disease. After reviewing the risks and benefits,                            the patient was deemed in satisfactory condition to                            undergo the procedure.                           After obtaining informed consent, the endoscope was                            passed under direct vision. Throughout  the                            procedure, the patient's blood pressure, pulse, and                            oxygen saturations were monitored continuously. The                            Olympus Scope [id number] was introduced through the                            mouth, and advanced to the second part of duodenum.                            The upper GI endoscopy was accomplished without                            difficulty. The patient tolerated the procedure                            well. Scope In: Scope Out: Findings:                 Esophagogastric landmarks were identified: the                            Z-line was found at 38 cm, the gastroesophageal                            junction was found at 38 cm and the upper extent of                            the gastric folds was found at 38 cm from the                            incisors.                           One benign-appearing, intrinsic  stenosis was found                            38 cm from the incisors. This stenosis measured                            less than one cm (in length). A TTS dilator was                            passed through the scope. Dilation with a                            12-13.5-15 mm balloon dilator was performed to 12                            mm, 13.5 mm and 15 mm after which appropriate                            dilation effect was noted. Biopsy forceps                            additionally used to open the stricture further.                           Mucosal changes including very mild longitudinal                            furrows were found in the middle third of the                            esophagus and in the lower third of the esophagus.                            Biopsies were obtained from the proximal and distal                            esophagus with cold forceps for histology to assess                            for eosinophilic esophagitis.                           The  exam of the esophagus was otherwise normal.                           The entire examined stomach was normal.                           The examined duodenum was normal. Complications:            No immediate complications. Estimated blood loss:                            Minimal. Estimated Blood Loss:  Estimated blood loss was minimal. Impression:               - Esophagogastric landmarks identified.                           - Benign-appearing esophageal stenosis. Dilated up                            to 15mm with good result.                           - Very mild mucosal changes of longitudinal                            furrowing - biopsies taken to ensure no EoE.                           - Normal esophagus otherwise.                           - Normal stomach.                           - Normal examined duodenum. Recommendation:           - Patient has a contact number available for                            emergencies. The signs and symptoms of potential                            delayed complications were discussed with the                            patient. Return to normal activities tomorrow.                            Written discharge instructions were provided to the                            patient.                           - Advance diet as tolerated. (post dilation diet)                           - Continue present medications.                           - Continue omeprazole  for now.                           - Await pathology results. Elspeth P. Leigh, MD 01/06/2024 2:00:38 PM This report has been signed electronically.

## 2024-01-06 NOTE — Telephone Encounter (Signed)
Referral sent to [facility] 

## 2024-01-07 ENCOUNTER — Telehealth: Payer: Self-pay

## 2024-01-07 NOTE — Telephone Encounter (Signed)
 Left message

## 2024-01-09 ENCOUNTER — Ambulatory Visit: Payer: Self-pay | Admitting: Gastroenterology

## 2024-01-09 LAB — SURGICAL PATHOLOGY

## 2024-01-21 NOTE — Telephone Encounter (Signed)
 Patient to see [facility] 02/18/24.

## 2024-03-01 ENCOUNTER — Ambulatory Visit: Admitting: Dermatology

## 2024-04-28 ENCOUNTER — Ambulatory Visit: Admitting: Dermatology

## 2024-04-28 ENCOUNTER — Ambulatory Visit: Admitting: Physician Assistant

## 2024-04-28 ENCOUNTER — Encounter: Payer: Self-pay | Admitting: Physician Assistant

## 2024-04-28 VITALS — BP 115/74 | HR 69

## 2024-04-28 DIAGNOSIS — D2239 Melanocytic nevi of other parts of face: Secondary | ICD-10-CM | POA: Diagnosis not present

## 2024-04-28 NOTE — Patient Instructions (Signed)

## 2024-04-28 NOTE — Progress Notes (Signed)
" ° °  New Patient Visit   Subjective  Dustin Mendez is a 58 y.o. male NEW PATIENT who presents for the following: spot  Pt has a spot on his nose he'd like evaluated that has been present for a few years. Pt has never had a skin cancer nor family hx  The following portions of the chart were reviewed this encounter and updated as appropriate: medications, allergies, medical history  Review of Systems:  No other skin or systemic complaints except as noted in HPI or Assessment and Plan.  Objective  Well appearing patient in no apparent distress; mood and affect are within normal limits.  A focused examination was performed of the following areas: face  Relevant exam findings are noted in the Assessment and Plan.    Assessment & Plan   Angiofibroma/Fibrous Papule tip of nose - 1-2 mm smooth symmetric flesh colored to pink papule(s) without features suspicious for malignancy on dermoscopy - Benign-appearing.  Observation.  Call clinic for new or changing lesions.    FIBROUS PAPULE OF NOSE    Return for next available TBSE.  I, Darice Smock, CMA, am acting as scribe for [employer], PA-C.   Documentation: I have reviewed the above documentation for accuracy and completeness, and I agree with the above.  Iysis Germain K, PA-C    "

## 2024-08-24 ENCOUNTER — Ambulatory Visit: Admitting: Physician Assistant

## 2024-12-02 ENCOUNTER — Encounter: Admitting: Family Medicine
